# Patient Record
Sex: Female | Born: 1935 | Race: White | Hispanic: No | Marital: Married | Smoking: Never smoker
Health system: Southern US, Community
[De-identification: ages and names within clinical notes are randomized; demographics above are authoritative.]

## PROBLEM LIST (undated history)

## (undated) DIAGNOSIS — W19XXXA Unspecified fall, initial encounter: Secondary | ICD-10-CM

## (undated) DIAGNOSIS — R296 Repeated falls: Secondary | ICD-10-CM

## (undated) DIAGNOSIS — G309 Alzheimer's disease, unspecified: Principal | ICD-10-CM

## (undated) DIAGNOSIS — C801 Malignant (primary) neoplasm, unspecified: Secondary | ICD-10-CM

## (undated) DIAGNOSIS — E785 Hyperlipidemia, unspecified: Secondary | ICD-10-CM

## (undated) DIAGNOSIS — E039 Hypothyroidism, unspecified: Secondary | ICD-10-CM

## (undated) DIAGNOSIS — F329 Major depressive disorder, single episode, unspecified: Secondary | ICD-10-CM

## (undated) DIAGNOSIS — F0280 Dementia in other diseases classified elsewhere without behavioral disturbance: Secondary | ICD-10-CM

## (undated) DIAGNOSIS — I10 Essential (primary) hypertension: Secondary | ICD-10-CM

## (undated) DIAGNOSIS — Z9289 Personal history of other medical treatment: Secondary | ICD-10-CM

## (undated) DIAGNOSIS — F32A Depression, unspecified: Secondary | ICD-10-CM

## (undated) HISTORY — DX: Hyperlipidemia, unspecified: E78.5

## (undated) HISTORY — DX: Personal history of other medical treatment: Z92.89

## (undated) HISTORY — DX: Repeated falls: R29.6

## (undated) HISTORY — DX: Unspecified fall, initial encounter: W19.XXXA

## (undated) HISTORY — DX: Essential (primary) hypertension: I10

## (undated) HISTORY — DX: Hypothyroidism, unspecified: E03.9

## (undated) HISTORY — DX: Malignant (primary) neoplasm, unspecified: C80.1

## (undated) HISTORY — DX: Alzheimer's disease, unspecified: G30.9

## (undated) HISTORY — DX: Dementia in other diseases classified elsewhere, unspecified severity, without behavioral disturbance, psychotic disturbance, mood disturbance, and anxiety: F02.80

---

## 2003-12-03 ENCOUNTER — Ambulatory Visit (HOSPITAL_COMMUNITY): Admission: RE | Admit: 2003-12-03 | Discharge: 2003-12-03 | Payer: Self-pay | Admitting: *Deleted

## 2003-12-03 HISTORY — PX: CARDIAC CATHETERIZATION: SHX172

## 2012-11-23 ENCOUNTER — Encounter: Payer: Self-pay | Admitting: Cardiovascular Disease

## 2012-11-23 ENCOUNTER — Encounter: Payer: Self-pay | Admitting: *Deleted

## 2012-11-26 ENCOUNTER — Encounter: Payer: Self-pay | Admitting: Cardiovascular Disease

## 2012-11-26 ENCOUNTER — Ambulatory Visit (INDEPENDENT_AMBULATORY_CARE_PROVIDER_SITE_OTHER): Payer: Medicare Other | Admitting: Cardiovascular Disease

## 2012-11-26 VITALS — BP 110/80 | HR 63 | Ht 63.0 in | Wt 100.0 lb

## 2012-11-26 DIAGNOSIS — Z79899 Other long term (current) drug therapy: Secondary | ICD-10-CM

## 2012-11-26 DIAGNOSIS — I10 Essential (primary) hypertension: Secondary | ICD-10-CM

## 2012-11-26 DIAGNOSIS — E785 Hyperlipidemia, unspecified: Secondary | ICD-10-CM

## 2012-11-26 NOTE — Assessment & Plan Note (Signed)
On Zetia and statin therapy. We will recheck a fasting lipid and liver profile

## 2012-11-26 NOTE — Progress Notes (Signed)
11/26/2012 Janet Knight   1934-01-17  [id number]  Primary Physician Janet Rossetti, MD Primary Cardiologist: Janet Gess MD Janet Knight   HPI:  The patient is a 79 year old, thin and frail-appearing, married Caucasian female, mother of 2, grandmother to 2 grandchildren whose husband is also a patient of mine. I saw her a year ago. Her problems include treated hypertension, dyslipidemia and strong family history for heart disease with a father that had an MI at age 41 and brothers in their 13s and 85s. She has never smoked nor is she diabetic. She has never had a heart attack or stroke. She is otherwise asymptomatic. She was catheterized by Dr. Lenise Knight December 03, 2003, and was found to have normal coronary arteries and normal LV function. Her last stress test July 07, 2009, was nonischemic.since I saw her a year ago she denies chest pain or shortness of breath.    Current Outpatient Prescriptions  Medication Sig Dispense Refill  . amLODipine (NORVASC) 5 MG tablet Take 5 mg by mouth daily.      Marland Kitchen aspirin 81 MG tablet Take 81 mg by mouth daily.      . cholecalciferol (VITAMIN D) 400 UNITS TABS tablet Take 400 Units by mouth daily.      . Cyanocobalamin (VITAMIN B 12 PO) Take 1,000 Units by mouth daily.      Marland Kitchen donepezil (ARICEPT) 10 MG tablet Take 10 mg by mouth at bedtime as needed.      . ezetimibe (ZETIA) 10 MG tablet Take 10 mg by mouth daily.      . hydrochlorothiazide (HYDRODIURIL) 25 MG tablet Take 12.5 mg by mouth daily.      Marland Kitchen levothyroxine (SYNTHROID, LEVOTHROID) 50 MCG tablet Take 50 mcg by mouth daily before breakfast.      . metoprolol tartrate (LOPRESSOR) 25 MG tablet Take 25 mg by mouth 2 (two) times daily.      . potassium chloride SA (K-DUR,KLOR-CON) 20 MEQ tablet Take 20 mEq by mouth 2 (two) times daily.      . simvastatin (ZOCOR) 20 MG tablet Take 20 mg by mouth every evening.       No current facility-administered medications for this visit.     Allergies  Allergen Reactions  . Lipitor [Atorvastatin]     myalgias    History   Social History  . Marital Status: Married    Spouse Name: N/A    Number of Children: N/A  . Years of Education: N/A   Occupational History  . Not on file.   Social History Main Topics  . Smoking status: Never Smoker   . Smokeless tobacco: Not on file  . Alcohol Use: Not on file  . Drug Use: Not on file  . Sexual Activity: Not on file   Other Topics Concern  . Not on file   Social History Narrative  . No narrative on file     Review of Systems: General: negative for chills, fever, night sweats or weight changes.  Cardiovascular: negative for chest pain, dyspnea on exertion, edema, orthopnea, palpitations, paroxysmal nocturnal dyspnea or shortness of breath Dermatological: negative for rash Respiratory: negative for cough or wheezing Urologic: negative for hematuria Abdominal: negative for nausea, vomiting, diarrhea, bright red blood per rectum, melena, or hematemesis Neurologic: negative for visual changes, syncope, or dizziness All other systems reviewed and are otherwise negative except as noted above.    Blood pressure 110/80, pulse 63, height 5\' 3"  (1.6 m), weight 100 lb (45.36  kg).  General appearance: alert and no distress Neck: no adenopathy, no carotid bruit, no JVD, supple, symmetrical, trachea midline and thyroid not enlarged, symmetric, no tenderness/mass/nodules Lungs: clear to auscultation bilaterally Heart: regular rate and rhythm, S1, S2 normal, no murmur, click, rub or gallop Extremities: extremities normal, atraumatic, no cyanosis or edema  EKG normal sinus rhythm at 63 without ST or T wave changes  ASSESSMENT AND PLAN:   No problem-specific assessment & plan notes found for this encounter.      Janet Gess MD FACP,FACC,FAHA, [facility] 11/26/2012 10:51 AM

## 2012-11-26 NOTE — Assessment & Plan Note (Signed)
Well-controlled on current medications 

## 2012-11-26 NOTE — Patient Instructions (Addendum)
  We will see you back in follow up in 1 year with Dr Berry  Dr Berry has ordered blood work to be done fasting.    

## 2012-12-04 ENCOUNTER — Other Ambulatory Visit: Payer: Self-pay | Admitting: *Deleted

## 2012-12-04 MED ORDER — EZETIMIBE 10 MG PO TABS: 10.0000 mg | ORAL_TABLET | Freq: Every day | ORAL | Status: DC

## 2012-12-04 NOTE — Telephone Encounter (Signed)
Rx was sent to pharmacy electronically. 

## 2012-12-17 ENCOUNTER — Other Ambulatory Visit: Payer: Self-pay | Admitting: *Deleted

## 2012-12-17 MED ORDER — HYDROCHLOROTHIAZIDE 25 MG PO TABS: 12.5000 mg | ORAL_TABLET | Freq: Every day | ORAL | Status: DC

## 2012-12-18 ENCOUNTER — Telehealth: Payer: Self-pay | Admitting: Cardiovascular Disease

## 2012-12-18 NOTE — Telephone Encounter (Signed)
Pharmacy states that in April of this year---Janet Knight was taking HCTZ 25mg  1 daily---received new rx for HCTZ 25 mg yesterday for 1/2 tab daily.  Which is correct?

## 2012-12-19 MED ORDER — HYDROCHLOROTHIAZIDE 25 MG PO TABS: 12.5000 mg | ORAL_TABLET | Freq: Every day | ORAL | Status: DC

## 2012-12-19 NOTE — Telephone Encounter (Signed)
Call to pt and confirmed pt is taking 1/2 of 25mg  tab.    Returned call and Amy informed.  Refills changed to #5.

## 2013-12-06 ENCOUNTER — Other Ambulatory Visit: Payer: Self-pay | Admitting: Cardiovascular Disease

## 2013-12-06 NOTE — Telephone Encounter (Signed)
Rx refill sent to patient pharmacy   

## 2014-01-06 ENCOUNTER — Other Ambulatory Visit: Payer: Self-pay | Admitting: Cardiovascular Disease

## 2014-01-06 NOTE — Telephone Encounter (Signed)
Rx was sent to pharmacy electronically. 

## 2014-02-05 ENCOUNTER — Other Ambulatory Visit: Payer: Self-pay | Admitting: Cardiovascular Disease

## 2014-02-06 ENCOUNTER — Other Ambulatory Visit: Payer: Self-pay

## 2014-02-06 MED ORDER — HYDROCHLOROTHIAZIDE 12.5 MG PO CAPS: 12.5000 mg | ORAL_CAPSULE | Freq: Every day | ORAL | Status: DC

## 2014-02-06 MED ORDER — EZETIMIBE 10 MG PO TABS: 10.0000 mg | ORAL_TABLET | Freq: Every day | ORAL | Status: DC

## 2014-02-06 NOTE — Telephone Encounter (Signed)
Rx was sent to pharmacy electronically. 

## 2014-02-06 NOTE — Telephone Encounter (Signed)
Rx refill sent to patient pharmacy   

## 2014-02-24 ENCOUNTER — Other Ambulatory Visit: Payer: Self-pay | Admitting: Cardiovascular Disease

## 2014-03-07 ENCOUNTER — Telehealth: Payer: Self-pay | Admitting: Cardiovascular Disease

## 2014-03-11 ENCOUNTER — Telehealth: Payer: Self-pay | Admitting: *Deleted

## 2014-03-11 NOTE — Telephone Encounter (Signed)
LMTCB - PATIENT NEEDS APPOINTMENT

## 2014-03-12 MED ORDER — EZETIMIBE 10 MG PO TABS: 10.0000 mg | ORAL_TABLET | Freq: Every day | ORAL | Status: DC

## 2014-03-12 NOTE — Telephone Encounter (Signed)
Janet Knight called in stating that the pt needs a refill on their Zetia and HCTZ. Please call  Thanks

## 2014-03-12 NOTE — Telephone Encounter (Signed)
Rx refill sent to patient pharmacy  Patient has appointment on 05/06/13

## 2014-04-11 ENCOUNTER — Other Ambulatory Visit: Payer: Self-pay | Admitting: Cardiovascular Disease

## 2014-04-22 ENCOUNTER — Ambulatory Visit (INDEPENDENT_AMBULATORY_CARE_PROVIDER_SITE_OTHER): Payer: Medicare HMO | Admitting: Neurology

## 2014-04-22 ENCOUNTER — Encounter: Payer: Self-pay | Admitting: Neurology

## 2014-04-22 VITALS — BP 112/62 | HR 58 | Ht 62.0 in | Wt 106.4 lb

## 2014-04-22 DIAGNOSIS — G309 Alzheimer's disease, unspecified: Secondary | ICD-10-CM

## 2014-04-22 DIAGNOSIS — F0280 Dementia in other diseases classified elsewhere without behavioral disturbance: Secondary | ICD-10-CM

## 2014-04-22 MED ORDER — SERTRALINE HCL 50 MG PO TABS: 50.0000 mg | ORAL_TABLET | Freq: Every day | ORAL | Status: DC

## 2014-04-22 NOTE — Patient Instructions (Signed)
Alzheimer Disease Caregiver Guide Alzheimer disease is an illness that affects a person's brain. It causes a person to lose the ability to remember things and make good decisions. As the disease progresses, the person is unable to take care of himself or herself and needs more and more help to do simple tasks. Taking care of someone with Alzheimer disease can be very challenging and overwhelming.  MEMORY LOSS AND CONFUSION Memory loss and confusion is mild in the beginning stages of the disease. Both of these problems become more severe as the disease progresses. Eventually, the person will not recognize places or even close family members and friends.   Stay calm.  Respond with a short explanation. Long explanations can be overwhelming and confusing.  Avoid corrections that sound like scolding.  Try not to take it personally, even if the person forgets your name. BEHAVIOR CHANGES Behavior changes are part of the disease. The person may develop depression, anxiety, anger, hallucinations, or other behavior changes. These changes can come on suddenly and may be in response to pain, infection, changes in the environment (temperature, noise), overstimulation, or feeling lost or scared.   Try not to take behavior changes personally.  Remain calm and patient.  Do not argue or try to convince the person about a specific point. This will only make him or her more agitated.  Know that the behavior changes are part of the disease process and try to work through it. TIPS TO REDUCE FRUSTRATION  Schedule wisely by making appointments and doing daily tasks, like bathing and dressing, when the person is at his or her best.  Take your time. Simple tasks may take a lot longer, so be sure to allow for plenty of time.  Limit choices. Too many choices can be overwhelming and stressful for the person.  Involve the person in what you are doing.  Stick to a routine.  Avoid new or crowded situations, if  possible.  Use simple words, short sentences, and a calm voice. Only give one direction at a time.  Buy clothes and shoes that are easy to put on and take off.  Let people help if they offer. HOME SAFETY Keeping the home safe is very important to reduce the risk of falls and injuries.   Keep floors clear of clutter. Remove rugs, magazine racks, and floor lamps.  Keep hallways well lit.  Put a handrail and nonslip mat in the bathtub or shower.  Put childproof locks on cabinets with dangerous items, such as medicine, alcohol, guns, toxic cleaning items, sharp tools or utensils, matches, or lighters.  Place locks on doors where the person cannot easily see or reach them. This helps ensure that the person cannot wander out of the house and get lost.  Be prepared for emergencies. Keep a list of emergency phone numbers and addresses in a convenient area. PLANS FOR THE FUTURE  Do not put off talking about finances.  Talk about money management. People with Alzheimer disease have trouble managing their money as the disease gets worse.  Get help from professional advisors regarding financial and legal matters.  Do not put off talking about future care.  Choose a power of attorney. This is someone who can make decisions for the person with Alzheimer disease when he or she is no longer able to do so.  Talk about driving and when it is the right time to stop. The person's health care provider can help give advice on this matter.  Talk about   the person's living situation. If he or she lives alone, you need to make sure he or she is safe. Some people need extra help at home, and others need more care at a nursing home or care center. SUPPORT GROUPS Joining a support group can be very helpful for caregivers of people with Alzheimer disease. Some advantages to being part of a support group include:   Getting strategies to manage stress.  Sharing experiences with others.  Receiving  emotional comfort and support.  Learning new caregiving skills as the disease progresses.  Knowing what community resources are available and taking advantage of them. SEEK MEDICAL CARE IF:  The person has a fever.  The person has a sudden change in behavior that does not improve with calming strategies.  The person is unable to manage in his or her current living situation.  The person threatens you or anyone else, including himself or herself.  You are no longer able to care for the person. Document Released: 12/01/2003 Document Revised: 08/05/2013 Document Reviewed: 04/27/2011 ExitCare Patient Information 2015 [employer]. This information is not intended to replace advice given to you by your health care provider. Make sure you discuss any questions you have with your health care provider.  

## 2014-04-22 NOTE — Progress Notes (Signed)
Reason for visit: memory disturbance  Janet Knight is a 81 y.o. female  History of present illness:  Janet Knight is a 81 year old right-handed white female with a progressive memory disturbance. The patient has had some issues for least a year or more with memory. She has been placed on Aricept, and she is tolerating this fairly well. The family indicates that she sleeps a lot day and night, and the husband tries to keep her busy during the day. The patient has not driven a car since 7253, she does not do the finances. She does have problems repeating himself, and misplacing things about the house. She reports no problems with falls or significant issues with balance. She does have issues controlling the bowels and the bladder. She denies any numbness or weakness of extremities. She reports no headaches or focal numbness or weakness of the face, arms, or legs. The patient in the past was on Seroquel for some agitation issues and this seemed to work relatively well. More recently, she has been placed on low-dose Zoloft without significant improvement in agitation over the last 3 weeks. The patient is sent to this office for further evaluation. The agitation is becoming more of an issue over the last 3 months.  Past Medical History  Diagnosis Date  . Hypertension   . Dyslipidemia   . History of stress test 07/07/2009    was nonischemic  . Cancer     skin  . Hx of echocardiogram 11/2003    positive for anterior ischemia  . History of stress test 07/2009    normal myocardial perfusion study.  . Hypothyroid   . Alzheimer's disease 04/22/2014    Past Surgical History  Procedure Laterality Date  . Cardiac catheterization  12/03/2003    was found to have normal coronary arteries and normal LV funtion    Family History  Problem Relation Age of Onset  . Heart attack Father 36  . Heart attack Brother   . Heart disease Brother   . Heart failure Mother   . Cancer Sister     liver  .  Dementia Neg Hx     Social history:  reports that she has never smoked. She has never used smokeless tobacco. She reports that she does not drink alcohol or use illicit drugs.  Medications:  Current Outpatient Prescriptions on File Prior to Visit  Medication Sig Dispense Refill  . amLODipine (NORVASC) 5 MG tablet Take 5 mg by mouth daily.    Marland Kitchen aspirin 81 MG tablet Take 81 mg by mouth daily.    . cholecalciferol (VITAMIN D) 400 UNITS TABS tablet Take 400 Units by mouth daily.    . Cyanocobalamin (VITAMIN B 12 PO) Take 1,000 Units by mouth daily.    Marland Kitchen donepezil (ARICEPT) 10 MG tablet Take 10 mg by mouth at bedtime as needed.    . ezetimibe (ZETIA) 10 MG tablet Take 1 tablet (10 mg total) by mouth daily. NEED APPOINTMENT FOR FUTURE REFILLS. 30 tablet 1  . hydrochlorothiazide (MICROZIDE) 12.5 MG capsule TAKE 1 CAPSULE BY MOUTH DAILY. 30 capsule 0  . levothyroxine (SYNTHROID, LEVOTHROID) 50 MCG tablet Take 50 mcg by mouth daily before breakfast.    . metoprolol tartrate (LOPRESSOR) 25 MG tablet Take 25 mg by mouth 2 (two) times daily.    . potassium chloride SA (K-DUR,KLOR-CON) 20 MEQ tablet Take 20 mEq by mouth 2 (two) times daily.    . simvastatin (ZOCOR) 20 MG tablet Take 20 mg  by mouth every evening.     No current facility-administered medications on file prior to visit.      Allergies  Allergen Reactions  . Lipitor [Atorvastatin]     myalgias    ROS:  Out of a complete 14 system review of symptoms, the patient complains only of the following symptoms, and all other reviewed systems are negative.  Incontinence, diarrhea Memory loss, confusion Anxiety, too much sleep, disinterest in activities Sleepiness  Blood pressure 112/62, pulse 58, height 5\' 2"  (1.575 m), weight 106 lb 6.4 oz (48.263 kg).  Physical Exam  General: The patient is alert and cooperative at the time of the examination.  Eyes: Pupils are equal, round, and reactive to light. Discs are flat  bilaterally.  Neck: The neck is supple, no carotid bruits are noted.  Respiratory: The respiratory examination is clear.  Cardiovascular: The cardiovascular examination reveals a regular rate and rhythm, no obvious murmurs or rubs are noted.  Skin: Extremities are without significant edema.  Neurologic Exam  Mental status: The Mini-Mental Status Examination done today shows a total score 14/30.  Cranial nerves: Facial symmetry is present. There is good sensation of the face to pinprick and soft touch bilaterally. The strength of the facial muscles and the muscles to head turning and shoulder shrug are normal bilaterally. Speech is well enunciated, no aphasia or dysarthria is noted. Extraocular movements are full. Visual fields are full. The tongue is midline, and the patient has symmetric elevation of the soft palate. No obvious hearing deficits are noted.  Motor: The motor testing reveals 5 over 5 strength of all 4 extremities. Good symmetric motor tone is noted throughout.  Sensory: Sensory testing is intact to pinprick, soft touch, vibration sensation, and position sense on all 4 extremities. No evidence of extinction is noted.  Coordination: Cerebellar testing reveals good finger-nose-finger and heel-to-shin bilaterally. There is some apraxia with the use of the lower extremities, however.  Gait and station: Gait is normal. Tandem gait is normal. Romberg is negative. No drift is seen.  Reflexes: Deep tendon reflexes are symmetric and normal bilaterally. Toes are downgoing bilaterally.   Assessment/Plan:  1. Progressive memory disturbance, Alzheimer's disease  2. Behavioral disturbance, agitation  The patient will be increased on the Zoloft taking 50 mg daily. In the future, the addition of Namenda may be done. Seroquel can be used again if needed, the family believes that this did help her agitation. The patient will follow-up in 6 months.  Janet Alexanders MD 04/22/2014 7:11  PM  [facility] [address] [place], [place] [address]  Phone [phone] Fax [phone]

## 2014-05-06 ENCOUNTER — Encounter: Payer: Self-pay | Admitting: Cardiovascular Disease

## 2014-05-06 ENCOUNTER — Ambulatory Visit (INDEPENDENT_AMBULATORY_CARE_PROVIDER_SITE_OTHER): Payer: Medicare HMO | Admitting: Cardiovascular Disease

## 2014-05-06 VITALS — BP 98/60 | HR 54 | Ht 61.0 in | Wt 107.4 lb

## 2014-05-06 DIAGNOSIS — E785 Hyperlipidemia, unspecified: Secondary | ICD-10-CM

## 2014-05-06 DIAGNOSIS — I10 Essential (primary) hypertension: Secondary | ICD-10-CM | POA: Diagnosis not present

## 2014-05-06 NOTE — Assessment & Plan Note (Signed)
History of hypertension with blood pressure measured at 98/68. She is on amlodipine hydrochlorothiazide and metoprolol. Continue current meds at current dosing

## 2014-05-06 NOTE — Assessment & Plan Note (Signed)
History of hyperlipidemia on Zetia and simvastatin followed by her PCP

## 2014-05-06 NOTE — Progress Notes (Signed)
05/06/2014 Ricke Hey   07-07-1933  [id number]  Primary Physician Gilford Rile, MD Primary Cardiologist: Lorretta Harp MD Renae Gloss   HPI:  The patient is a 81 year old, thin and frail-appearing, married Caucasian female, mother of 2, grandmother to 2 grandchildren whose husband is also a patient of mine. I last saw her a year and a half ago. Her problems include treated hypertension, dyslipidemia and strong family history for heart disease with a father that had an MI at age 107 and brothers in their 71s and 63s. She has never smoked nor is she diabetic. She has never had a heart attack or stroke. She is otherwise asymptomatic. She was catheterized by Dr. Alla German December 03, 2003, and was found to have normal coronary arteries and normal LV function. Her last stress test July 07, 2009, was nonischemic.since I saw her a year ago she denies chest pain or shortness of breath.  Current Outpatient Prescriptions  Medication Sig Dispense Refill  . amLODipine (NORVASC) 5 MG tablet Take 5 mg by mouth daily.    Marland Kitchen aspirin 81 MG tablet Take 81 mg by mouth daily.    . cholecalciferol (VITAMIN D) 400 UNITS TABS tablet Take 400 Units by mouth daily.    . Cyanocobalamin (VITAMIN B 12 PO) Take 1,000 Units by mouth daily.    Marland Kitchen donepezil (ARICEPT) 10 MG tablet Take 10 mg by mouth at bedtime as needed.    . ezetimibe (ZETIA) 10 MG tablet Take 1 tablet (10 mg total) by mouth daily. NEED APPOINTMENT FOR FUTURE REFILLS. 30 tablet 1  . hydrochlorothiazide (MICROZIDE) 12.5 MG capsule TAKE 1 CAPSULE BY MOUTH DAILY. 30 capsule 0  . levothyroxine (SYNTHROID, LEVOTHROID) 50 MCG tablet Take 50 mcg by mouth daily before breakfast.    . metoprolol tartrate (LOPRESSOR) 25 MG tablet Take 25 mg by mouth 2 (two) times daily.    . potassium chloride SA (K-DUR,KLOR-CON) 20 MEQ tablet Take 20 mEq by mouth 2 (two) times daily.    . sertraline (ZOLOFT) 50 MG tablet Take 1 tablet (50 mg total) by mouth  daily. 30 tablet 1  . simvastatin (ZOCOR) 20 MG tablet Take 20 mg by mouth every evening.     No current facility-administered medications for this visit.    Allergies  Allergen Reactions  . Lipitor [Atorvastatin]     myalgias    History   Social History  . Marital Status: Married    Spouse Name: N/A    Number of Children: 2  . Years of Education: N/A   Occupational History  . Not on file.   Social History Main Topics  . Smoking status: Never Smoker   . Smokeless tobacco: Never Used  . Alcohol Use: No  . Drug Use: No  . Sexual Activity: Not on file   Other Topics Concern  . Not on file   Social History Narrative   Patient is right handed   Patient drinks 2 cups caffeine daily.     Review of Systems: General: negative for chills, fever, night sweats or weight changes.  Cardiovascular: negative for chest pain, dyspnea on exertion, edema, orthopnea, palpitations, paroxysmal nocturnal dyspnea or shortness of breath Dermatological: negative for rash Respiratory: negative for cough or wheezing Urologic: negative for hematuria Abdominal: negative for nausea, vomiting, diarrhea, bright red blood per rectum, melena, or hematemesis Neurologic: negative for visual changes, syncope, or dizziness All other systems reviewed and are otherwise negative except as noted above.  Blood pressure 98/60, pulse 54, height 5\' 1"  (1.549 m), weight 107 lb 6.4 oz (48.716 kg).  General appearance: alert and no distress Neck: no adenopathy, no carotid bruit, no JVD, supple, symmetrical, trachea midline and thyroid not enlarged, symmetric, no tenderness/mass/nodules Lungs: clear to auscultation bilaterally Heart: regular rate and rhythm, S1, S2 normal, no murmur, click, rub or gallop Extremities: extremities normal, atraumatic, no cyanosis or edema  EKG sinus bradycardia 54 without ST or T-wave changes. There was low voltage with left axis deviation and septal Q waves. I personally  reviewed this EKG  ASSESSMENT AND PLAN:   Hyperlipidemia History of hyperlipidemia on Zetia and simvastatin followed by her PCP   Essential hypertension History of hypertension with blood pressure measured at 98/68. She is on amlodipine hydrochlorothiazide and metoprolol. Continue current meds at current dosing       Lorretta Harp MD [facility], [facility] 05/06/2014 2:50 PM

## 2014-05-06 NOTE — Patient Instructions (Signed)
Your physician wants you to follow-up in: 1 Year. You will receive a reminder letter in the mail two months in advance. If you don't receive a letter, please call our office to schedule the follow-up appointment.  

## 2014-05-26 ENCOUNTER — Telehealth: Payer: Self-pay | Admitting: Adult Health

## 2014-05-26 NOTE — Telephone Encounter (Signed)
I called the patient's son. They're currently at the patient's primary care office. He states that they took the patient there because she's been crying all afternoon and agitated. He is unable to talk right now. He explained that he will try to call me back today. I explained that if I'm no longer in the office I will speak to them tomorrow at their appointment at 11:30 AM.

## 2014-05-26 NOTE — Telephone Encounter (Signed)
Patient's son Janet Knight is calling because his mother is having significant aggressive behavior issues and he doesn't know how to handle her.  He did schedule an appointment tomorrow 2/23 but needs advice today if possible. Please call.  Thank you!

## 2014-05-27 ENCOUNTER — Encounter: Payer: Self-pay | Admitting: Adult Health

## 2014-05-27 ENCOUNTER — Ambulatory Visit (INDEPENDENT_AMBULATORY_CARE_PROVIDER_SITE_OTHER): Payer: Medicare HMO | Admitting: Adult Health

## 2014-05-27 VITALS — BP 108/69 | HR 56 | Ht 64.0 in | Wt 105.0 lb

## 2014-05-27 DIAGNOSIS — F0391 Unspecified dementia with behavioral disturbance: Secondary | ICD-10-CM

## 2014-05-27 DIAGNOSIS — F03918 Unspecified dementia, unspecified severity, with other behavioral disturbance: Secondary | ICD-10-CM

## 2014-05-27 MED ORDER — MEMANTINE HCL 28 X 5 MG & 21 X 10 MG PO TABS: ORAL_TABLET | ORAL | Status: DC
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
## 2014-05-27 NOTE — Patient Instructions (Signed)
Continue CIPRO Start Namenda once CIPRO is finished. If namenda is tolerated- please call after titration pack is finished to start the maintenance dose.  If agitation persist we will consider starting Risperidone  Risperidone tablets What is this medicine? RISPERIDONE (ris PER i done) is an antipsychotic. It is used to treat schizophrenia, bipolar disorder, and some symptoms of autism. This medicine may be used for other purposes; ask your health care provider or pharmacist if you have questions. COMMON BRAND NAME(S): Risperdal What should I tell my health care provider before I take this medicine? They need to know if you have any of these conditions: -blood disorder or disease -dementia -diabetes or a family history of diabetes -difficulty swallowing -heart disease or previous heart attack -history of brain tumor or head injury -history of breast cancer -irregular heartbeat or low blood pressure -kidney or liver disease -Parkinson's disease -seizures (convulsions) -an unusual or allergic reaction to risperidone, other medicines, foods, dyes, or preservatives -pregnant or trying to get pregnant -breast-feeding How should I use this medicine? Take this medicine by mouth with a glass of water. Follow the directions on the prescription label. You can take it with or without food. If it upsets your stomach, take it with food. Take your medicine at regular intervals. Do not take it more often than directed. Do not stop taking except on your doctor's advice. Talk to your pediatrician regarding the use of this medicine in children. While this drug may be prescribed for children as young as 21 years of age for selected conditions, precautions do apply. Overdosage: If you think you have taken too much of this medicine contact a poison control center or emergency room at once. NOTE: This medicine is only for you. Do not share this medicine with others. What if I miss a dose? If you miss a dose,  take it as soon as you can. If it is almost time for your next dose, take only that dose. Do not take double or extra doses. What may interact with this medicine? Do not take this medicine with any of the following medications: -certain medicines for fungal infections like fluconazole, itraconazole, ketoconazole, posaconazole, voriconazole -cisapride -dofetilide -dronedarone -droperidol -pimozide -sparfloxacin -thioridazine -ziprasidone This medicine may also interact with the following medications: -arsenic trioxide -certain antibiotics like clarithromycin, gatifloxacin, levofloxacin, moxifloxacin, pentamidine, rifampin -certain medicines for blood pressure -certain medicines for cancer -certain medicines for irregular heart beat -certain medications for Parkinson's disease like levodopa -certain medicines for seizures like carbamazepine -certain medicines for sleep or sedation -narcotic medicines for pain -other medicines for mental anxiety, depression, or psychotic disturbances -other medicines that prolong the QT interval (cause an abnormal heart rhythm) -ritonavir This list may not describe all possible interactions. Give your health care provider a list of all the medicines, herbs, non-prescription drugs, or dietary supplements you use. Also tell them if you smoke, drink alcohol, or use illegal drugs. Some items may interact with your medicine. What should I watch for while using this medicine? Visit your doctor or health care professional for regular checks on your progress. It may be several weeks before you see the full effects. Do not suddenly stop taking this medicine. You may need to gradually reduce the dose. Only stop taking this medicine on the advice of your doctor or health care professional. Dennis Bast may get dizzy or drowsy. Do not drive, use machinery, or do anything that needs mental alertness until you know how this medicine affects you. Do not stand or sit  up quickly,  especially if you are an older patient. This reduces the risk of dizzy or fainting spells. Alcohol can increase dizziness and drowsiness. Avoid alcoholic drinks. You can get a hangover effect the morning after a bedtime dose. Do not treat yourself for colds, diarrhea or allergies. Ask your doctor or health care professional for advice, some nonprescription medicines may increase possible side effects. This medicine can reduce the response of your body to heat or cold. Dress warm in cold weather and stay hydrated in hot weather. If possible, avoid extreme temperatures like saunas, hot tubs, very hot or cold showers, or activities that can cause dehydration such as vigorous exercise. What side effects may I notice from receiving this medicine? Side effects that you should report to your doctor or health care professional as soon as possible: -aching muscles and joints -confusion -fainting spells -fast or irregular heartbeat -fever or chills, sore throat -increased hunger or thirst -increased urination -loss of balance, difficulty walking or falls -stiffness, spasms, trembling -uncontrollable head, mouth, neck, arm, or leg movements -unusually weak or tired Side effects that usually do not require medical attention (report to your doctor or health care professional if they continue or are bothersome): -constipation -decreased sexual ability -difficulty sleeping -drowsiness or dizziness -increase or decrease in saliva -nausea, vomiting -weight gain This list may not describe all possible side effects. Call your doctor for medical advice about side effects. You may report side effects to FDA at 1-800-FDA-1088. Where should I keep my medicine? Keep out of the reach of children. Store at room temperature between 15 and 25 degrees C (59 and 77 degrees F). Protect from light. Throw away any unused medicine after the expiration date. NOTE: This sheet is a summary. It may not cover all possible  information. If you have questions about this medicine, talk to your doctor, pharmacist, or health care provider.  2015, Elsevier/Gold Standard. (2012-10-15 14:22:54)  Memantine Tablets What is this medicine? MEMANTINE (MEM an teen) is used to treat dementia caused by Alzheimer's disease. This medicine may be used for other purposes; ask your health care provider or pharmacist if you have questions. COMMON BRAND NAME(S): Namenda What should I tell my health care provider before I take this medicine? They need to know if you have any of these conditions: -difficulty passing urine -kidney disease -liver disease -seizures -an unusual or allergic reaction to memantine, other medicines, foods, dyes, or preservatives -pregnant or trying to get pregnant -breast-feeding How should I use this medicine? Take this medicine by mouth with a glass of water. Follow the directions on the prescription label. You may take this medicine with or without food. Take your doses at regular intervals. Do not take your medicine more often than directed. Continue to take your medicine even if you feel better. Do not stop taking except on the advice of your doctor or health care professional. Talk to your pediatrician regarding the use of this medicine in children. Special care may be needed. Overdosage: If you think you have taken too much of this medicine contact a poison control center or emergency room at once. NOTE: This medicine is only for you. Do not share this medicine with others. What if I miss a dose? If you miss a dose, take it as soon as you can. If it is almost time for your next dose, take only that dose. Do not take double or extra doses. If you do not take your medicine for several days, contact your health  care provider. Your dose may need to be changed. What may interact with this  medicine? -acetazolamide -amantadine -cimetidine -dextromethorphan -dofetilide -hydrochlorothiazide -ketamine -metformin -methazolamide -quinidine -ranitidine -sodium bicarbonate -triamterene This list may not describe all possible interactions. Give your health care provider a list of all the medicines, herbs, non-prescription drugs, or dietary supplements you use. Also tell them if you smoke, drink alcohol, or use illegal drugs. Some items may interact with your medicine. What should I watch for while using this medicine? Visit your doctor or health care professional for regular checks on your progress. Check with your doctor or health care professional if there is no improvement in your symptoms or if they get worse. You may get drowsy or dizzy. Do not drive, use machinery, or do anything that needs mental alertness until you know how this drug affects you. Do not stand or sit up quickly, especially if you are an older patient. This reduces the risk of dizzy or fainting spells. Alcohol can make you more drowsy and dizzy. Avoid alcoholic drinks. What side effects may I notice from receiving this medicine? Side effects that you should report to your doctor or health care professional as soon as possible: -allergic reactions like skin rash, itching or hives, swelling of the face, lips, or tongue -agitation or a feeling of restlessness -depressed mood -dizziness -hallucinations -redness, blistering, peeling or loosening of the skin, including inside the mouth -seizures -vomiting Side effects that usually do not require medical attention (report to your doctor or health care professional if they continue or are bothersome): -constipation -diarrhea -headache -nausea -trouble sleeping This list may not describe all possible side effects. Call your doctor for medical advice about side effects. You may report side effects to FDA at 1-800-FDA-1088. Where should I keep my medicine? Keep  out of the reach of children. Store at room temperature between 15 degrees and 30 degrees C (59 degrees and 86 degrees F). Throw away any unused medicine after the expiration date. NOTE: This sheet is a summary. It may not cover all possible information. If you have questions about this medicine, talk to your doctor, pharmacist, or health care provider.  2015, Elsevier/Gold Standard. (2013-01-07 14:10:42)

## 2014-05-27 NOTE — Progress Notes (Signed)
I have read the note, and I agree with the clinical assessment and plan.  Marcelo Ickes KEITH   

## 2014-05-28 ENCOUNTER — Telehealth: Payer: Self-pay | Admitting: Adult Health
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
## 2014-05-29 ENCOUNTER — Telehealth: Payer: Self-pay | Admitting: *Deleted
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
## 2014-05-29 NOTE — Telephone Encounter (Signed)
Patient son calling wanting to give an update on the patient since calling 911 yesterday. The patient went to [facility] yesterday but was discharged. Please call

## 2014-05-30 ENCOUNTER — Telehealth: Payer: Self-pay | Admitting: Neurology

## 2014-05-30 MED ORDER — RISPERIDONE 1 MG PO TABS: 1.0000 mg | ORAL_TABLET | Freq: Every day | ORAL | Status: DC
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
## 2014-05-30 NOTE — Telephone Encounter (Signed)
Dear Janet Knight it seems Mrs. Janet Knight and Janet Knight's family is truly struggling with agitation and almost psychotic behaviors at home she was seen at the local ER and even admitted to [facility] over the last 2 nights with a UTI which is likely to be blamed for her behavioral decompensation. Her husband Janet Knight is at the end of this strength.  The hospital prescribed 100 mg Seroquel for this rather petite lady and I learned that Janet Knight had offered to call in risperidol, . I called the Risperdal in at 1 mg up to twice a day - I would rather be reluctant to take such a high-dose of Seroquel for this patient.  I also asked the family to family to look into [place]- psychiatry, as a form of getting medication adjusted and indirectly as a respite care for Janet Knight.  Since she has recurrent UTIs and is not hydrating very well I advised the son to by some cranberry dried extract or cranberry dried pills she will not be drinking that heart juices he explained that he is optimistic that he could get her to swallow a pill form of concentrated cranberry.  Janet Knight

## 2014-05-30 NOTE — Telephone Encounter (Signed)
I called the patient's son. I left a voicemail. He can call our office at his convenience.

## 2014-05-30 NOTE — Telephone Encounter (Signed)
Patients family stated they did not receive a call back, patient is very agitated, 911 called twice, Megan called resperidol.
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
## 2014-06-03 ENCOUNTER — Telehealth: Payer: Self-pay | Admitting: *Deleted

## 2014-06-03 MED ORDER — MEMANTINE HCL 28 X 5 MG & 21 X 10 MG PO TABS: ORAL_TABLET | ORAL | Status: DC

## 2014-06-03 MED ORDER — RISPERIDONE 1 MG PO TABS: 1.0000 mg | ORAL_TABLET | Freq: Every day | ORAL | Status: DC

## 2014-06-03 NOTE — Telephone Encounter (Signed)
RX faxed [phone].

## 2014-06-03 NOTE — Telephone Encounter (Signed)
Prescriptions printed. Hinton Dyer can you fax these medications. Thanks.

## 2014-06-03 NOTE — Telephone Encounter (Signed)
Order for Namenda and Risperdal needs to be faxed to [facility]. The patient will be there for 30 days. Please call with any questions [phone]

## 2014-06-12 ENCOUNTER — Other Ambulatory Visit: Payer: Self-pay | Admitting: Cardiovascular Disease

## 2014-06-12 ENCOUNTER — Other Ambulatory Visit: Payer: Self-pay | Admitting: Neurology

## 2014-06-13 NOTE — Telephone Encounter (Signed)
Rx has been sent to the pharmacy electronically. ° °

## 2014-06-19 ENCOUNTER — Encounter: Payer: Self-pay | Admitting: Neurology

## 2014-06-19 ENCOUNTER — Telehealth: Payer: Self-pay | Admitting: Neurology

## 2014-06-19 NOTE — Telephone Encounter (Signed)
Patient's son Cecilie Lowers is calling to get a letter stating that patient is competent to sign legal documents. Please call patient's son to discuss. Thank you.

## 2014-06-19 NOTE — Telephone Encounter (Signed)
I called the son. I will dictate a letter indicating that the patient is competent to sign basic legal documents. The patient is in a mild range of dementia, she has had significant improvement in her mental status following the urinary tract infection.

## 2014-07-08 ENCOUNTER — Telehealth: Payer: Self-pay | Admitting: Neurology

## 2014-07-08 NOTE — Telephone Encounter (Signed)
Patient's son Janet Knight, stated patient has been on Rx memantine (NAMENDA TITRATION PAK) tablet pack for at least a month. For the last 2.5 weeks, patient sits with eyes close and have to be reminded to eat.  Her mobility while walking is compeletly off.  Went to hospital for BW.  Questioning what to do?  Please call and advise.

## 2014-07-08 NOTE — Telephone Encounter (Signed)
I called the son. The patient has had increased drowsiness, sleepiness over the last several days. She has gone to the hospital, blood work as been done, no obvious source of her somnolence has been identified.  The patient has recently gotten up to the full dose of Namenda, and Namenda can cause somnolence, we will discontinue the drug, the patient does not improve, she may require a revisit through this office.  I will fax orders to discontinue the Namenda to [phone], attention Greg.

## 2014-08-14 ENCOUNTER — Telehealth: Payer: Self-pay | Admitting: Neurology

## 2014-08-14 NOTE — Telephone Encounter (Signed)
Janet Knight, pt's on son called wanting to speak with Dr. Jannifer Franklin regarding pt's script for memantine [facility] TITRATION PAK) tablet pack. He states that patient condition is slowly getting worse and would Like to know what his suggestion would be on the use for [facility]. Please call and advise. Janet Knight can be reached @ [phone]

## 2014-08-14 NOTE — Telephone Encounter (Signed)
I called the son. The patient has had a transition over the last couple months, increased weakness, difficulty walking. The patient has improved with the drowsiness off of Namenda, but she still has not gotten back to her baseline. She is not having agitation. The patient is on Risperdal, I wonder if she is developing parkinsonian features related to this medication. The other possibility is that she is getting severe depression, with withdrawal. The patient will be seen in office tomorrow by Cecille Rubin. I may be able to see the patient briefly tomorrow while she is here.

## 2014-08-15 ENCOUNTER — Encounter: Payer: Self-pay | Admitting: Nurse Practitioner

## 2014-08-15 ENCOUNTER — Ambulatory Visit (INDEPENDENT_AMBULATORY_CARE_PROVIDER_SITE_OTHER): Payer: Medicare HMO | Admitting: Nurse Practitioner

## 2014-08-15 VITALS — BP 130/74 | HR 67 | Ht 64.0 in | Wt 105.6 lb

## 2014-08-15 DIAGNOSIS — G309 Alzheimer's disease, unspecified: Secondary | ICD-10-CM | POA: Diagnosis not present

## 2014-08-15 DIAGNOSIS — Z5181 Encounter for therapeutic drug level monitoring: Secondary | ICD-10-CM

## 2014-08-15 DIAGNOSIS — G2119 Other drug induced secondary parkinsonism: Secondary | ICD-10-CM

## 2014-08-15 DIAGNOSIS — F0280 Dementia in other diseases classified elsewhere without behavioral disturbance: Secondary | ICD-10-CM

## 2014-08-15 NOTE — Patient Instructions (Signed)
Stop Risperdal Check MRI of the brain with and without contrast  thyroid B-12,  copper today Follow-up in 3 months
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
## 2014-08-15 NOTE — Progress Notes (Signed)
I have read the note, and I agree with the clinical assessment and plan.  WILLIS,CHARLES KEITH   

## 2014-08-16 LAB — BASIC METABOLIC PANEL
BUN/Creatinine Ratio: 19 (ref 11–26)
BUN: 17 mg/dL (ref 8–27)
CO2: 24 mmol/L (ref 18–29)
CREATININE: 0.91 mg/dL (ref 0.57–1.00)
Calcium: 9.4 mg/dL (ref 8.7–10.3)
Chloride: 98 mmol/L (ref 97–108)
GFR calc Af Amer: 70 mL/min/{1.73_m2} (ref >59)
GFR calc non Af Amer: 61 mL/min/{1.73_m2} (ref >59)
Glucose: 84 mg/dL (ref 65–99)
POTASSIUM: 4.5 mmol/L (ref 3.5–5.2)
SODIUM: 139 mmol/L (ref 134–144)

## 2014-08-16 LAB — VITAMIN B12: VITAMIN B 12: 989 pg/mL — AB (ref 211–946)

## 2014-08-16 LAB — CERULOPLASMIN: CERULOPLASMIN: 27.9 mg/dL (ref 19.0–39.0)

## 2014-08-16 LAB — TSH: TSH: 6.97 u[IU]/mL — ABNORMAL HIGH (ref 0.450–4.500)

## 2014-08-18 ENCOUNTER — Telehealth: Payer: Self-pay | Admitting: *Deleted

## 2014-08-18 NOTE — Telephone Encounter (Signed)
-----   Message from Dennie Bible, NP sent at 08/18/2014  9:33 AM EDT ----- Please let nsg home know TSH is high. PCP can manage. Please let son know as well Send copy

## 2014-08-18 NOTE — Telephone Encounter (Signed)
LMVM for pt or son to call back re: lab results.

## 2014-08-20 NOTE — Progress Notes (Signed)
Quick Note:  I called and spoke to husband and gave him the results of the lab tests. (normal except for elevated TSH). She resides at [facility]. [phone]. Will fax lab results to them. He will also mention to them as well. Fax # [phone] ______

## 2014-08-20 NOTE — Telephone Encounter (Signed)
I called and spoke ot husband, then son, Cecilie Lowers and let him know about the lab results.  Elevated TSH.  She is taking synthroid 51mcg daily now.  I spoke to Cassie Freer and then Seth Bake who is in the nursing office.  They received copy of lab results.   Dr. Marjorie Smolder to be by tomorrow and see pts.  He will address this then.  Also son, wanted me to check about them receiving hard copy to stop Risperdal.  Seth Bake stated that they have, and was stopped  08-15-14.

## 2014-09-02 ENCOUNTER — Ambulatory Visit
Admission: RE | Admit: 2014-09-02 | Discharge: 2014-09-02 | Disposition: A | Discharge status: Home / Self Care | Payer: Commercial Managed Care - HMO | Source: Ambulatory Visit | Attending: Nurse Practitioner | Admitting: Nurse Practitioner

## 2014-09-02 ENCOUNTER — Telehealth: Payer: Self-pay | Admitting: Nurse Practitioner

## 2014-09-02 DIAGNOSIS — F0280 Dementia in other diseases classified elsewhere without behavioral disturbance: Secondary | ICD-10-CM

## 2014-09-02 DIAGNOSIS — G309 Alzheimer's disease, unspecified: Principal | ICD-10-CM

## 2014-09-02 NOTE — Telephone Encounter (Signed)
Patient's son Che Below @[phone] is calling in regard to the MRI results and the need for a medication to accomplish this.   Also he would like to speak with Dr. Jannifer Franklin about the her change in being aggitaterdas well as her elevated thyroid. He would also like to speak with Dr Jannifer Franklin  about changes since she is not taking risperaeal. Please call. Thanks!

## 2014-09-02 NOTE — Telephone Encounter (Signed)
I called Cecilie Lowers. I explained that we normally give xanax for MRIs and I would make sure it was okay with Dr. Jannifer Franklin for the patient to have it. He verbalized understanding. He stated that he cannot tell a difference in his mother's ability to walk/move since taking her off the Risperdal. He stated that today he noticed her get "snappy" with a nurse and he is worried she may become agitated now that she is off the Risperdal. He feels like maybe his mother is mimicking the other residents at her nursing facility. He does not think all of these symptoms are related to the disease process alone. He stated the nursing facility checked her thyroid levels again and they were still high. He wondered what should be done about this. He would like to talk to Dr. Jannifer Franklin.

## 2014-09-02 NOTE — Telephone Encounter (Signed)
LMVM for Janet Knight, son of pt to return call.  (normally use xanax for MRI).  Also wanted to know about TSH results when MD seen at her facility.

## 2014-09-02 NOTE — Telephone Encounter (Signed)
I called the son. The patient may not have any benefit with walking off of the Risperdal for 6-8 weeks following cessation of the drug. It may be too early to see any difference. The patient is agitated, other medications may need to be used for agitation, potentially could use Depakote. Okay to give alprazolam prior to the MRI study.

## 2014-09-02 NOTE — Telephone Encounter (Signed)
Patient returned call and requested to speak to [facility]. Please call and advise.

## 2014-09-02 NOTE — Telephone Encounter (Signed)
I called and Cecilie Lowers had spoken to [place], Therapist, sports.  I relayed she sent message to Dr. Jannifer Franklin.  [phone] Craig's #.

## 2014-09-02 NOTE — Telephone Encounter (Signed)
Remo Lipps with [facility] imaging called and stated that they could not preform the MRI on the patient because she would not stop moving. She stated that the patient may need medication to complete the MRI and requested to speak with someone regarding this. Please call and advise.

## 2014-09-03 NOTE — Telephone Encounter (Signed)
I called Cecilie Lowers to let him know Xanax is ready at the front desk for him to pick up for his mother's MRI scan. I explained that he would need a photo id. I also explained to him how to take the Xanax and that all of the information is written out on the package.

## 2014-10-09 ENCOUNTER — Ambulatory Visit: Payer: Medicare HMO | Admitting: Nurse Practitioner

## 2014-10-09 ENCOUNTER — Telehealth: Payer: Self-pay | Admitting: *Deleted

## 2014-10-09 ENCOUNTER — Ambulatory Visit: Payer: Medicare HMO | Admitting: Adult Health

## 2014-10-09 NOTE — Telephone Encounter (Signed)
Please note that f/u appt was to be with Dr. Jannifer Franklin

## 2014-10-09 NOTE — Telephone Encounter (Signed)
I called pts son, Cecilie Lowers.   He would like pt seen for behavioral changes (did have labs here but attempted MRI x one back on 09-02-14 (could not stay still).  Did get xanax but has not been rescheduled since due to son's schedule (out of town a lot).  He would accompany her.  Has noted lashing out (sundown), repetitive words, wandering, behavioral changes.  Would forward to CM/NP.  Pt needs to have MRI prior to being seen.  DanaC spoke to pts son and facility.  Will get MRI first and then see afterwards.

## 2014-10-09 NOTE — Telephone Encounter (Addendum)
Patient has been scheduled for her MRI  7/12/16and patient will need to follow up  with Dr. Jannifer Franklin . Patient son Cecilie Lowers will need to come to apt with his mother because Dad forget's some things I stated to Cecilie Lowers I will call him back with a follow up with Dr. Jannifer Franklin. Called Craig Back patient is scheduled with Dr. Jannifer Franklin. For Follow up June 27th with Dr. Jannifer Franklin MRI June 12 th .   Charisse Klinefelter  Will you call Cecilie Lowers patient's son he is on [place] at [phone] to give him instructions on Xanax ok to leave detailed message.

## 2014-10-09 NOTE — Telephone Encounter (Signed)
Called and spoke to Patient's son. And relayed patient really needed to have her MRI done be fore she came back for follow . Patient's son was understanding . Son Cecilie Lowers relayed that he will be in town next  Week and will get MRI rescheduled. Offered to [place] to call and reschedule MRI. Cecilie Lowers stated he will take care of rescheduling MRI. Cecilie Lowers was ok with this process and he will speak to his father as well.

## 2014-10-09 NOTE — Telephone Encounter (Signed)
I called Cecilie Lowers and left a voicemail. I explained that the instructions on the packet state to take 1-2 tablets 30 minutes prior to MRI and right before entering the scanner, the 3rd tablet may be taken, if needed. I also advised that the patient is not to drive after taking the Xanax. I asked Cecilie Lowers to please call me back if he had any specific questions I did not answer.

## 2014-10-14 ENCOUNTER — Ambulatory Visit
Admission: RE | Admit: 2014-10-14 | Discharge: 2014-10-14 | Disposition: A | Discharge status: Home / Self Care | Payer: Commercial Managed Care - HMO | Source: Ambulatory Visit | Attending: Nurse Practitioner | Admitting: Nurse Practitioner

## 2014-10-14 MED ORDER — GADOBENATE DIMEGLUMINE 529 MG/ML IV SOLN
10.0000 mL | Freq: Once | INTRAVENOUS | Status: AC | PRN
  Administered 2014-10-14: 10 mL via INTRAVENOUS

## 2014-10-20 ENCOUNTER — Telehealth: Payer: Self-pay | Admitting: Neurology

## 2014-10-20 NOTE — Telephone Encounter (Signed)
I called the son. The patient has had some worsening of agitation off of the Risperdal, no significant change in the ability to ambulate. The patient has been placed on Ativan on a scheduled basis, twice daily over the last couple weeks with some worsening of agitation. Depakote has been added recently, again the patient has worsened on this medication. I will try to call the extended care facility at [phone] and have them stop the Depakote. The patient will be seen through this office on 10/29/2014. We will need to go with the medications at that point, we may get the patient back on an anti-psychotic medication such as Abilify or Zyprexa. The Zoloft could also be increased.   I called the nurse. The patient is to come off of the Depakote. She is on alprazolam at this time, taken off of the Ativan taking 0.25 mg as needed. I will see her in the office later this month.
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
## 2014-10-20 NOTE — Telephone Encounter (Signed)
Patient's son Janet Knight is calling and would like to spealk with Dr. Jannifer Franklin about some things going on with his mother. He states the facility has put her on depokate and he has noticed an extreme change with his mother's behavior.  He is going out of town and would appreciate a call by 2:00.  If not possible he will be landing at destination by 5:00 and can be reached.

## 2014-10-21 ENCOUNTER — Ambulatory Visit: Payer: Commercial Managed Care - HMO | Admitting: Adult Health

## 2014-10-29 ENCOUNTER — Ambulatory Visit (INDEPENDENT_AMBULATORY_CARE_PROVIDER_SITE_OTHER): Payer: Medicare HMO | Admitting: Neurology

## 2014-10-29 ENCOUNTER — Encounter: Payer: Self-pay | Admitting: Neurology

## 2014-10-29 VITALS — BP 106/53 | HR 57 | Ht 62.0 in | Wt 102.6 lb

## 2014-10-29 DIAGNOSIS — F0280 Dementia in other diseases classified elsewhere without behavioral disturbance: Secondary | ICD-10-CM

## 2014-10-29 DIAGNOSIS — G309 Alzheimer's disease, unspecified: Secondary | ICD-10-CM

## 2014-10-29 NOTE — Progress Notes (Signed)
Reason for visit: Alzheimer's disease  Janet Knight is an 81 y.o. female  History of present illness:  Janet Knight is a 81 year old right-handed white female with a progressive dementing illness consistent with Alzheimer's disease. The patient had been on Risperdal, she developed a gait disorder that may have been related to secondary parkinsonism. The patient was taken off of this medication, but no clear improvement of the walking was seen. The patient has had a fall within the last several weeks. She did not sustain significant injury. Off of the Risperdal, her agitation has gotten much worse. Use of lorazepam and Depakote were not effective. The patient is on low-dose alprazolam taking 0.25 mg twice daily if needed, this has no effect on her. The patient is brought in today for further evaluation.  Past Medical History  Diagnosis Date  . Hypertension   . Dyslipidemia   . History of stress test 07/07/2009    was nonischemic  . Cancer     skin  . Hx of echocardiogram 11/2003    positive for anterior ischemia  . History of stress test 07/2009    normal myocardial perfusion study.  . Hypothyroid   . Alzheimer's disease 04/22/2014  . Falls     Past Surgical History  Procedure Laterality Date  . Cardiac catheterization  12/03/2003    was found to have normal coronary arteries and normal LV funtion    Family History  Problem Relation Age of Onset  . Heart attack Father 29  . Heart attack Brother   . Heart disease Brother   . Heart failure Mother   . Cancer Sister     liver  . Dementia Neg Hx     Social history:  reports that she has never smoked. She has never used smokeless tobacco. She reports that she does not drink alcohol or use illicit drugs.    Allergies  Allergen Reactions  . Lipitor [Atorvastatin]     myalgias    Medications:  Prior to Admission medications   Medication Sig Start Date End Date Taking? Authorizing Provider  acetaminophen (TYLENOL)  500 MG tablet Take 1,000 mg by mouth every 6 (six) hours as needed.   Yes Historical Provider, MD  ALPRAZolam Duanne Moron) 0.5 MG tablet Take 0.5 mg by mouth 2 (two) times daily as needed for anxiety.   Yes Historical Provider, MD  alum & mag hydroxide-simeth (MAALOX/MYLANTA) 200-200-20 MG/5ML suspension Take 30 mLs by mouth every 6 (six) hours as needed for indigestion or heartburn.   Yes Historical Provider, MD  amLODipine (NORVASC) 5 MG tablet Take 5 mg by mouth daily.   Yes Historical Provider, MD  aspirin 325 MG tablet Take 325 mg by mouth daily.   Yes Historical Provider, MD  aspirin 81 MG tablet Take 81 mg by mouth daily.   Yes Historical Provider, MD  calcium citrate-vitamin D (CITRACAL+D) 315-200 MG-UNIT per tablet Take 1 tablet by mouth 2 (two) times daily.   Yes Historical Provider, MD  cholecalciferol (VITAMIN D) 1000 UNITS tablet Take 2,000 Units by mouth daily.   Yes Historical Provider, MD  Cyanocobalamin (VITAMIN B 12 PO) Take 1,000 Units by mouth daily.   Yes Historical Provider, MD  donepezil (ARICEPT) 10 MG tablet Take 10 mg by mouth at bedtime as needed.   Yes Historical Provider, MD  ezetimibe (ZETIA) 10 MG tablet Take 1 tablet (10 mg total) by mouth daily. NEED APPOINTMENT FOR FUTURE REFILLS. 03/12/14  Yes Lorretta Harp, MD  guaiFENesin (ROBITUSSIN) 100 MG/5ML liquid Take 200 mg by mouth 3 (three) times daily as needed for cough.   Yes Historical Provider, MD  hydrochlorothiazide (MICROZIDE) 12.5 MG capsule TAKE 1 CAPSULE BY MOUTH DAILY. 06/13/14  Yes Lorretta Harp, MD  levothyroxine (SYNTHROID, LEVOTHROID) 50 MCG tablet Take 50 mcg by mouth daily before breakfast.   Yes Historical Provider, MD  loperamide (IMODIUM) 2 MG capsule Take 2 mg by mouth as needed for diarrhea or loose stools.   Yes Historical Provider, MD  metoprolol tartrate (LOPRESSOR) 25 MG tablet Take 25 mg by mouth 2 (two) times daily.   Yes Historical Provider, MD  Multiple Vitamins-Minerals (MULTIVITAMIN WITH  IRON-MINERALS) liquid Take by mouth daily.   Yes Historical Provider, MD  OLANZapine (ZYPREXA) 2.5 MG tablet Take 2.5 mg by mouth at bedtime.   Yes Historical Provider, MD  potassium chloride SA (K-DUR,KLOR-CON) 20 MEQ tablet Take 20 mEq by mouth 2 (two) times daily.   Yes Historical Provider, MD  sertraline (ZOLOFT) 50 MG tablet TAKE 1 TABLET BY MOUTH DAILY. 06/12/14  Yes Kathrynn Ducking, MD  simvastatin (ZOCOR) 20 MG tablet Take 20 mg by mouth every evening.   Yes Historical Provider, MD    ROS:  Out of a complete 14 system review of symptoms, the patient complains only of the following symptoms, and all other reviewed systems are negative.  Fatigue Shortness of breath, chest tightness Swollen abdomen, incontinence of bowels Wandering Walking difficulty Memory loss, dizziness, headache, weakness Agitation, behavior problem, confusion, nervousness  Blood pressure 106/53, pulse 57, height 5\' 2"  (1.575 m), weight 102 lb 9.6 oz (46.539 kg).  Physical Exam  General: The patient is alert and cooperative at the time of the examination.  Skin: No significant peripheral edema is noted.   Neurologic Exam  Mental status: The patient is alert and oriented x 1 at the time of the examination (not oriented to place or date). The Mini-Mental status examination done today shows a total score of 10/30. The patient is not able to name any animals in 30 seconds.   Cranial nerves: Facial symmetry is present. Speech is normal, no aphasia or dysarthria is noted. Extraocular movements are full. Visual fields are full to threat.  Motor: The patient has good strength in all 4 extremities.  Sensory examination: Soft touch sensation is difficult to assess.  Coordination: The patient has good finger-nose-finger bilaterally, the patient refuses to do heel-to-shin.  Gait and station: The patient is able to walk independently, she has good stability, good turns.  Reflexes: Deep tendon reflexes are  symmetric.   Assessment/Plan:  1. Alzheimer's disease  2. Mild gait disorder  3. Agitation  The patient has had worsening behavior off of Risperdal. We will switch to Zyprexa which may have fewer effects creating parkinsonism. The patient will be increased on the alprazolam taking 0.5 mg twice daily if needed. She will follow-up in 3 or 4 months. The family will contact me if further issues are noted.  Jill Alexanders MD 10/30/2014 7:45 AM  [facility] [address] [place], [place] [address]  Phone [phone] Fax [phone]

## 2014-10-29 NOTE — Patient Instructions (Addendum)
We will increase the alprazolam to 0.5 mg twice daily if needed. We will add Zyprexa 2.5 mg tablets to be taken daily. Please contact our office if the desired effects are not seen. Otherwise, will follow-up in about 3 months.   Alzheimer Disease Alzheimer disease is a mental disorder. It causes memory loss and loss of other mental functions, such as learning, thinking, problem solving, communicating, and completing tasks. The mental losses interfere with the ability to perform daily activities at work, at home, or in social situations. Alzheimer disease usually starts in a person's late 32s or early 15s but can start earlier in life (familial form). The mental changes caused by this disease are permanent and worsen over time. As the illness progresses, the ability to do even the simplest things is lost. Survival with Alzheimer disease ranges from several years to as long as 20 years. CAUSES Alzheimer disease is caused by abnormally high levels of a protein (beta-amyloid) in the brain. This protein forms very small deposits within and around the brain's nerve cells. These deposits prevent the nerve cells from working properly. Experts are not certain what causes the beta-amyloid deposits in this disease. RISK FACTORS The following major risk factors have been identified:  Increasing age.  Certain genetic variations, such as Down syndrome (trisomy 21). SYMPTOMS In the early stages of Alzheimer disease, you are still able to perform daily activities but need greater effort, more time, or memory aids. Early symptoms include:  Mild memory loss of recent events, names, or phone numbers.  Loss of objects.  Minor loss of vocabulary.  Difficulty with complex tasks, such as paying bills or driving in unfamiliar locations. Other mental functions deteriorate as the disease worsens. These changes slowly go from mild to severe. Symptoms at this stage include:  Difficulty remembering. You may not be  able to recall personal information such as your address and telephone number. You may become confused about the date, the season of the year, or your location.  Difficulty maintaining attention. You may forget what you wanted to say during conversations and repeat what you have already said.  Difficulty learning new information or tasks. You may not remember what you read or the name of a new friend you met.  Difficulty counting or doing math. You may have difficulty with complex math problems. You may make mistakes in paying bills or managing your checkbook.  Poor reasoning and judgment. You may make poor decisions or not dress right for the weather.  Difficulty communicating. You may have regular difficulty remembering words, naming objects, expressing yourself clearly, or writing sentences that make sense.  Difficulty performing familiar daily activities. You may get lost driving in familiar locations or need help eating, bathing, dressing, grooming, or using the toilet. You may have difficulty maintaining bladder or bowel control.  Difficulty recognizing familiar faces. You may confuse family members or close friends with one another. You may not recognize a close relative or may mistake strangers for family. Alzheimer disease also may cause changes in personality and behavior. These changes include:   Loss of interest or motivation.  Social withdrawal.  Anxiety.  Difficulty sleeping.  Uncharacteristic anger or combativeness.  A false belief that someone is trying to harm you (paranoia).  Seeing things that are not real (hallucinations).  Agitation. Confusion and disruptive behavior are often worse at night and may be triggered by changes in the environment or acute medical issues. DIAGNOSIS  Alzheimer disease is diagnosed through an assessment by your  health care provider. During this assessment, your health care provider will do the following:  Ask you and your family,  friends, or caregivers questions about your symptoms, their frequency, their duration and progression, and the effect they are having on your life.  Ask questions about your personal and family medical history and use of alcohol or drugs, including prescription medicine.  Perform a physical exam and order blood tests and brain imaging exams. Your health care provider may refer you to a specialist for detailed evaluation of your mental functions (neuropsychological testing).  Many different brain disorders, medical conditions, and certain substances can cause symptoms that resemble Alzheimer disease symptoms. These must be ruled out before this disease can be diagnosed. If Alzheimer disease is diagnosed, it will be considered either "possible" or "probable" Alzheimer disease. "Possible" Alzheimer disease means that your symptoms are typical of the disease and no other disorder is causing them. "Probable" Alzheimer disease means that you also have a family history of the disease or genetic test results that support the diagnosis. Certain tests, mostly used in research studies, are highly specific for Alzheimer disease.  TREATMENT  There is currently no cure for this disease. The goals of treatment are to:  Slow down the progression of the disease.  Preserve mental function as long as possible.  Manage behavioral symptoms.  Make life easier for the person with Alzheimer disease and his or her caregivers. The following treatment options are available:  Medicine. Certain medicines may help slow memory loss by changing the level of certain chemicals in the brain. Medicine may also help with behavioral symptoms.  Talk therapy. Talk therapy provides education, support, and memory aids for people with this disease. It is most effective in the early stages of the illness.  Caregiving. Caregivers may be family members, friends, or trained medical professionals. They help the person with Alzheimer disease  with daily life activities. Caregiving may take place at home or at a nursing facility.  Family support groups. These provide education, emotional support, and information about community resources to family members who are taking care of the person with this disease. Document Released: 12/01/2003 Document Revised: 08/05/2013 Document Reviewed: 07/27/2012 [facility] Patient Information 2015 [place], [place]. This information is not intended to replace advice given to you by your health care provider. Make sure you discuss any questions you have with your health care provider.

## 2014-11-17 ENCOUNTER — Ambulatory Visit: Payer: Medicare HMO | Admitting: Nurse Practitioner

## 2014-12-10 ENCOUNTER — Telehealth: Payer: Self-pay | Admitting: Neurology

## 2014-12-10 NOTE — Telephone Encounter (Signed)
I called the extended care facility. The patient has had increasing problems with behavior over the last week, okay to increase Zyprexa to 5 mg at night. I gave the verbal order.

## 2014-12-10 NOTE — Telephone Encounter (Signed)
I called [name]. She stated that since Dr. Jannifer Franklin started the patient on Zyprexa, she has been better. She still had her moments but for the past week she has been uncontrollable. She has been crying and having panic attacks. She yells at the other residents. They have not seen her consistently agitated like this since she started Zyprexa. [name] stated that the patient's son told her Dr. Jannifer Franklin had mentioned going up on the dosage if this medication, if need be. They would like to increased the dosage, if possible.

## 2014-12-10 NOTE — Telephone Encounter (Signed)
Phone Call from [facility] in [place] [phone] regarding Patient's Son requesting increase on OLANZapine (ZYPREXA) 2.5 MG tablet because she has had increase in anxiety, tearfulness, agitation over the past week.

## 2015-01-22 ENCOUNTER — Telehealth: Payer: Self-pay | Admitting: Neurology

## 2015-01-22 NOTE — Telephone Encounter (Signed)
I called the patient's son, Janet Knight. He stated that Ryan sent the patient to [facility] (12-17-14) d/t to her increased aggression, even after Zyprexa was increased to 5 mg. Since being there, the Zyprexa has been increased to 15 mg (according to [facility]). She is still aggressive and requiring prn medication. Janet Knight wondered if maybe a medication other than Zyprexa should be tried? He is also concerned that they are focusing on sending the patient to a special assistance medicaid facility. He feels like she may be better suited for a skilled nursing facility, and he also believes she may be accepted there easier. She has been denied to all special assistance facilities so far. Janet Knight would like to speak to Dr. Jannifer Franklin about this.

## 2015-01-22 NOTE — Telephone Encounter (Signed)
Pt's son called stating he is concerned about pt's aggression. She is at [facility] and facility is stating she is doing great. He feels she is not getting the best care. He is concerned if she is discharged would she be most suited for skilled Nursing Care or Assisted Living/Alzheimer facility. The facility states they have contacted [facility] and he is questioning if this is true. He is inquiring if OLANZapine (ZYPREXA) 5 MG tablet should be changed to higher dose or if another medication could be used. Please call and advise at [phone].

## 2015-01-22 NOTE — Telephone Encounter (Signed)
I called the son, left a message, I will call back later. 

## 2015-01-23 NOTE — Telephone Encounter (Signed)
I called the son. The patient has had ongoing issues with agitation, Zyprexa dose is been increased to 15 mg daily. They are trying to place the patient back into an extended care facility. I will try to call the hospital in [place], I will be unable to call in orders as I do not have privileges there. The number is [phone]. The patient is on the fourth floor, code #192. It is possible that a switch to another medication a be helpful. Abilify may be more effective. The patient has been on Risperdal previously, but she developed a gait disorder associated with secondary parkinsonism on this medication. The Risperdal was more effective for the agitation, however.  I called [facility]. I left my cell phone number with the nurse, the treating physician may call me at any time regarding this patient.

## 2015-01-31 ENCOUNTER — Encounter (HOSPITAL_COMMUNITY): Payer: Self-pay | Admitting: *Deleted

## 2015-01-31 ENCOUNTER — Emergency Department (HOSPITAL_COMMUNITY)
Admission: EM | Admit: 2015-01-31 | Discharge: 2015-02-01 | Disposition: A | Discharge status: Home / Self Care | Payer: Medicare HMO | Attending: Emergency Medicine | Admitting: Emergency Medicine

## 2015-01-31 ENCOUNTER — Emergency Department (HOSPITAL_COMMUNITY): Payer: Medicare HMO

## 2015-01-31 DIAGNOSIS — G309 Alzheimer's disease, unspecified: Secondary | ICD-10-CM | POA: Diagnosis not present

## 2015-01-31 DIAGNOSIS — Y998 Other external cause status: Secondary | ICD-10-CM | POA: Insufficient documentation

## 2015-01-31 DIAGNOSIS — Z85828 Personal history of other malignant neoplasm of skin: Secondary | ICD-10-CM | POA: Insufficient documentation

## 2015-01-31 DIAGNOSIS — W19XXXA Unspecified fall, initial encounter: Secondary | ICD-10-CM

## 2015-01-31 DIAGNOSIS — Y9389 Activity, other specified: Secondary | ICD-10-CM | POA: Diagnosis not present

## 2015-01-31 DIAGNOSIS — Z79899 Other long term (current) drug therapy: Secondary | ICD-10-CM | POA: Diagnosis not present

## 2015-01-31 DIAGNOSIS — Z7982 Long term (current) use of aspirin: Secondary | ICD-10-CM | POA: Insufficient documentation

## 2015-01-31 DIAGNOSIS — E039 Hypothyroidism, unspecified: Secondary | ICD-10-CM | POA: Insufficient documentation

## 2015-01-31 DIAGNOSIS — Z043 Encounter for examination and observation following other accident: Secondary | ICD-10-CM | POA: Insufficient documentation

## 2015-01-31 DIAGNOSIS — W1839XA Other fall on same level, initial encounter: Secondary | ICD-10-CM | POA: Insufficient documentation

## 2015-01-31 DIAGNOSIS — E785 Hyperlipidemia, unspecified: Secondary | ICD-10-CM | POA: Diagnosis not present

## 2015-01-31 DIAGNOSIS — Y9289 Other specified places as the place of occurrence of the external cause: Secondary | ICD-10-CM | POA: Insufficient documentation

## 2015-01-31 DIAGNOSIS — I10 Essential (primary) hypertension: Secondary | ICD-10-CM | POA: Diagnosis not present

## 2015-01-31 NOTE — ED Provider Notes (Signed)
CSN: [id number]     Arrival date & time 01/31/15  2149 History   First MD Initiated Contact with Patient 01/31/15 2210     Chief Complaint  Patient presents with  . Fall     (Consider location/radiation/quality/duration/timing/severity/associated sxs/prior Treatment) Patient is a 81 y.o. female presenting with fall. The history is limited by the condition of the patient.  Fall  Unable to obtain history due to patient's severe dementia.  History provided by EMS.  Janet Knight is a 81 y.o. female with PMH significant for HTN, dyslipidemia, and dementia who presents via EMS from [place] after an unwitnessed fall.  Per EMS, patient was found by facility on the ground after an unwitnessed fall.  It is the facility protocol to send patient's with dementia to ED.  EMS noted no injuries.  Patient is extremely confused which appears to be her baseline.   Past Medical History  Diagnosis Date  . Hypertension   . Dyslipidemia   . History of stress test 07/07/2009    was nonischemic  . Cancer ([place])     skin  . Hx of echocardiogram 11/2003    positive for anterior ischemia  . History of stress test 07/2009    normal myocardial perfusion study.  . Hypothyroid   . Alzheimer's disease 04/22/2014  . Falls    Past Surgical History  Procedure Laterality Date  . Cardiac catheterization  12/03/2003    was found to have normal coronary arteries and normal LV funtion   Family History  Problem Relation Age of Onset  . Heart attack Father 31  . Heart attack Brother   . Heart disease Brother   . Heart failure Mother   . Cancer Sister     liver  . Dementia Neg Hx    Social History  Substance Use Topics  . Smoking status: Never Smoker   . Smokeless tobacco: Never Used  . Alcohol Use: No   OB History    No data available     Review of Systems  Unable to perform ROS: Dementia      Allergies  Lipitor  Home Medications   Prior to Admission medications   Medication  Sig Start Date End Date Taking? Authorizing Provider  acetaminophen (TYLENOL) 500 MG tablet Take 1,000 mg by mouth every 6 (six) hours as needed.    Historical Provider, MD  ALPRAZolam Duanne Moron) 0.5 MG tablet Take 0.5 mg by mouth 2 (two) times daily as needed for anxiety.    Historical Provider, MD  alum & mag hydroxide-simeth (MAALOX/MYLANTA) 200-200-20 MG/5ML suspension Take 30 mLs by mouth every 6 (six) hours as needed for indigestion or heartburn.    Historical Provider, MD  amLODipine (NORVASC) 5 MG tablet Take 5 mg by mouth daily.    Historical Provider, MD  aspirin 325 MG tablet Take 325 mg by mouth daily.    Historical Provider, MD  aspirin 81 MG tablet Take 81 mg by mouth daily.    Historical Provider, MD  calcium citrate-vitamin D (CITRACAL+D) 315-200 MG-UNIT per tablet Take 1 tablet by mouth 2 (two) times daily.    Historical Provider, MD  cholecalciferol (VITAMIN D) 1000 UNITS tablet Take 2,000 Units by mouth daily.    Historical Provider, MD  Cyanocobalamin (VITAMIN B 12 PO) Take 1,000 Units by mouth daily.    Historical Provider, MD  donepezil (ARICEPT) 10 MG tablet Take 10 mg by mouth at bedtime as needed.    Historical Provider, MD  ezetimibe (  ZETIA) 10 MG tablet Take 1 tablet (10 mg total) by mouth daily. NEED APPOINTMENT FOR FUTURE REFILLS. 03/12/14   Lorretta Harp, MD  guaiFENesin (ROBITUSSIN) 100 MG/5ML liquid Take 200 mg by mouth 3 (three) times daily as needed for cough.    Historical Provider, MD  hydrochlorothiazide (MICROZIDE) 12.5 MG capsule TAKE 1 CAPSULE BY MOUTH DAILY. 06/13/14   Lorretta Harp, MD  levothyroxine (SYNTHROID, LEVOTHROID) 50 MCG tablet Take 50 mcg by mouth daily before breakfast.    Historical Provider, MD  loperamide (IMODIUM) 2 MG capsule Take 2 mg by mouth as needed for diarrhea or loose stools.    Historical Provider, MD  metoprolol tartrate (LOPRESSOR) 25 MG tablet Take 25 mg by mouth 2 (two) times daily.    Historical Provider, MD  Multiple  Vitamins-Minerals (MULTIVITAMIN WITH IRON-MINERALS) liquid Take by mouth daily.    Historical Provider, MD  OLANZapine (ZYPREXA) 5 MG tablet Take 5 mg by mouth at bedtime.    Historical Provider, MD  potassium chloride SA (K-DUR,KLOR-CON) 20 MEQ tablet Take 20 mEq by mouth 2 (two) times daily.    Historical Provider, MD  sertraline (ZOLOFT) 50 MG tablet TAKE 1 TABLET BY MOUTH DAILY. 06/12/14   Kathrynn Ducking, MD  simvastatin (ZOCOR) 20 MG tablet Take 20 mg by mouth every evening.    Historical Provider, MD   BP 140/100 mmHg  Pulse 74  Temp(Src) 98.2 F (36.8 C) (Oral)  Resp 18  SpO2 100% Physical Exam  Constitutional: She appears well-developed and well-nourished.  HENT:  Head: Normocephalic and atraumatic.  Mouth/Throat: Oropharynx is clear and moist.  Eyes: Conjunctivae are normal. Pupils are equal, round, and reactive to light.  Neck: Normal range of motion. Neck supple.  No cervical midline tenderness.   Cardiovascular: Normal rate, regular rhythm, normal heart sounds and intact distal pulses.   No murmur heard. Pulmonary/Chest: Effort normal and breath sounds normal. No accessory muscle usage or stridor. No respiratory distress. She has no wheezes. She has no rhonchi. She has no rales.  Abdominal: Soft. Bowel sounds are normal. She exhibits no distension. There is no tenderness.  Musculoskeletal: Normal range of motion.  Lymphadenopathy:    She has no cervical adenopathy.  Neurological: She is alert.  Speech clear without dysarthria.  Skin: Skin is warm and dry.  No signs of trauma.   Psychiatric: She has a normal mood and affect. Her behavior is normal.    ED Course  Procedures (including critical care time) Labs Review Labs Reviewed - No data to display  Imaging Review Ct Head Wo Contrast  01/31/2015  CLINICAL DATA:  Unwitnessed fall EXAM: CT HEAD WITHOUT CONTRAST CT CERVICAL SPINE WITHOUT CONTRAST TECHNIQUE: Multidetector CT imaging of the head and cervical spine  was performed following the standard protocol without intravenous contrast. Multiplanar CT image reconstructions of the cervical spine were also generated. COMPARISON:  Head CT dated 05/29/2014. FINDINGS: CT HEAD FINDINGS Again noted is generalized brain atrophy with commensurate dilatation of the ventricles and sulci. Chronic small vessel ischemic changes again noted within the bilateral periventricular white matter regions. Small old lacunar infarcts again noted within the basal ganglia. There is no mass, hemorrhage, edema, or other evidence of acute parenchymal abnormality. No extra-axial hemorrhage. No osseous fracture or dislocation. CT CERVICAL SPINE FINDINGS Mild degenerative change noted within the mid and lower cervical spine with slight disc space narrowings and minimal osseous spurring. Associated mild disc bulges at multiple levels are causing mild central canal stenoses.  Cervical spine alignment is normal. No fracture line or displaced fracture fragment identified. Facet joints are well aligned throughout. Atherosclerotic calcifications noted at the left carotid bulb. Scarring/fibrosis noted at each lung apex. Paravertebral soft tissues otherwise unremarkable. IMPRESSION: 1. No evidence of acute intracranial abnormality. No intracranial mass, hemorrhage, or edema. Atrophy and chronic ischemic changes as detailed above. 2. No fracture or acute subluxation within the cervical spine. Chronic/degenerative changes detailed above. Electronically Signed   By: Franki Cabot M.D.   On: 01/31/2015 22:59   Ct Cervical Spine Wo Contrast  01/31/2015  CLINICAL DATA:  Unwitnessed fall EXAM: CT HEAD WITHOUT CONTRAST CT CERVICAL SPINE WITHOUT CONTRAST TECHNIQUE: Multidetector CT imaging of the head and cervical spine was performed following the standard protocol without intravenous contrast. Multiplanar CT image reconstructions of the cervical spine were also generated. COMPARISON:  Head CT dated 05/29/2014.  FINDINGS: CT HEAD FINDINGS Again noted is generalized brain atrophy with commensurate dilatation of the ventricles and sulci. Chronic small vessel ischemic changes again noted within the bilateral periventricular white matter regions. Small old lacunar infarcts again noted within the basal ganglia. There is no mass, hemorrhage, edema, or other evidence of acute parenchymal abnormality. No extra-axial hemorrhage. No osseous fracture or dislocation. CT CERVICAL SPINE FINDINGS Mild degenerative change noted within the mid and lower cervical spine with slight disc space narrowings and minimal osseous spurring. Associated mild disc bulges at multiple levels are causing mild central canal stenoses. Cervical spine alignment is normal. No fracture line or displaced fracture fragment identified. Facet joints are well aligned throughout. Atherosclerotic calcifications noted at the left carotid bulb. Scarring/fibrosis noted at each lung apex. Paravertebral soft tissues otherwise unremarkable. IMPRESSION: 1. No evidence of acute intracranial abnormality. No intracranial mass, hemorrhage, or edema. Atrophy and chronic ischemic changes as detailed above. 2. No fracture or acute subluxation within the cervical spine. Chronic/degenerative changes detailed above. Electronically Signed   By: Franki Cabot M.D.   On: 01/31/2015 22:59   I have personally reviewed and evaluated these images and lab results as part of my medical decision-making.   EKG Interpretation None      MDM   Final diagnoses:  Fall, initial encounter    Patient presents via EMS after unwitnessed fall at [facility].  Facility protocol to send dementia patients after fall to ED.  No injuries noted per EMS.  VSS, NAD.  Patient alert, not oriented to person, place, or time.  Unable to obtain any history due to patient's dementia.  On exam, no signs of trauma.  Heart RRR, lungs CTAB, abdomen soft, nontender.  Distal pulses intact. Will obtain head CT  and cervical spine.  No other imaging or labs indicated at this time.   CT cervical spine and head show no evidence of fracture, intracranial abnormality, or subluxation or fracture of cervical spine.  Patient stable for discharge.  VSS, NAD.    Case has been discussed with and seen by Dr. Dayna Barker who agrees with the above plan for discharge.        Janet Loan, PA-C 01/31/15 2340  Merrily Pew, MD 02/01/15 [phone]

## 2015-01-31 NOTE — ED Notes (Signed)
[facility] called  

## 2015-01-31 NOTE — ED Notes (Signed)
Pt is very confused as per her norm,  Bed in lowest position,  Warm blankets applied,  Frequent checks , lights dimmed

## 2015-01-31 NOTE — ED Provider Notes (Signed)
Medical screening examination/treatment/procedure(s) were conducted as a shared visit with non-physician practitioner(s) and myself.  I personally evaluated the patient during the encounter.  Unwitnessed fall. Severe dementia, does not provide history. Exam without evidence of significant trauma. Awakes to answer questions. No lacerations or bony ttp. Plan to ct head/c spine and dc to faciliyt if normal.   Merrily Pew, MD 02/01/15 [phone]

## 2015-01-31 NOTE — ED Notes (Signed)
Pt is sleeping rr 18.

## 2015-01-31 NOTE — ED Notes (Signed)
Pt arrived via EMS from [facility],  She was an unwitnessed fall,  No injuries noted,  It is protocol to send dementia patients to hospital to be evaluated

## 2015-01-31 NOTE — Discharge Instructions (Signed)
Fall Prevention in the Home   Falls can cause injuries. They can happen to people of all ages. There are many things you can do to make your home safe and to help prevent falls.   WHAT CAN I DO ON THE OUTSIDE OF MY HOME?  · Regularly fix the edges of walkways and driveways and fix any cracks.  · Remove anything that might make you trip as you walk through a door, such as a raised step or threshold.  · Trim any bushes or trees on the path to your home.  · Use bright outdoor lighting.  · Clear any walking paths of anything that might make someone trip, such as rocks or tools.  · Regularly check to see if handrails are loose or broken. Make sure that both sides of any steps have handrails.  · Any raised decks and porches should have guardrails on the edges.  · Have any leaves, snow, or ice cleared regularly.  · Use sand or salt on walking paths during winter.  · Clean up any spills in your garage right away. This includes oil or grease spills.  WHAT CAN I DO IN THE BATHROOM?   · Use night lights.  · Install grab bars by the toilet and in the tub and shower. Do not use towel bars as grab bars.  · Use non-skid mats or decals in the tub or shower.  · If you need to sit down in the shower, use a plastic, non-slip stool.  · Keep the floor dry. Clean up any water that spills on the floor as soon as it happens.  · Remove soap buildup in the tub or shower regularly.  · Attach bath mats securely with double-sided non-slip rug tape.  · Do not have throw rugs and other things on the floor that can make you trip.  WHAT CAN I DO IN THE BEDROOM?  · Use night lights.  · Make sure that you have a light by your bed that is easy to reach.  · Do not use any sheets or blankets that are too big for your bed. They should not hang down onto the floor.  · Have a firm chair that has side arms. You can use this for support while you get dressed.  · Do not have throw rugs and other things on the floor that can make you trip.  WHAT CAN I DO IN  THE KITCHEN?  · Clean up any spills right away.  · Avoid walking on wet floors.  · Keep items that you use a lot in easy-to-reach places.  · If you need to reach something above you, use a strong step stool that has a grab bar.  · Keep electrical cords out of the way.  · Do not use floor polish or wax that makes floors slippery. If you must use wax, use non-skid floor wax.  · Do not have throw rugs and other things on the floor that can make you trip.  WHAT CAN I DO WITH MY STAIRS?  · Do not leave any items on the stairs.  · Make sure that there are handrails on both sides of the stairs and use them. Fix handrails that are broken or loose. Make sure that handrails are as long as the stairways.  · Check any carpeting to make sure that it is firmly attached to the stairs. Fix any carpet that is loose or worn.  · Avoid having throw rugs at the top   or bottom of the stairs. If you do have throw rugs, attach them to the floor with carpet tape.  · Make sure that you have a light switch at the top of the stairs and the bottom of the stairs. If you do not have them, ask someone to add them for you.  WHAT ELSE CAN I DO TO HELP PREVENT FALLS?  · Wear shoes that:    Do not have high heels.    Have rubber bottoms.    Are comfortable and fit you well.    Are closed at the toe. Do not wear sandals.  · If you use a stepladder:    Make sure that it is fully opened. Do not climb a closed stepladder.    Make sure that both sides of the stepladder are locked into place.    Ask someone to hold it for you, if possible.  · Clearly mark and make sure that you can see:    Any grab bars or handrails.    First and last steps.    Where the edge of each step is.  · Use tools that help you move around (mobility aids) if they are needed. These include:    Canes.    Walkers.    Scooters.    Crutches.  · Turn on the lights when you go into a dark area. Replace any light bulbs as soon as they burn out.  · Set up your furniture so you have a clear  path. Avoid moving your furniture around.  · If any of your floors are uneven, fix them.  · If there are any pets around you, be aware of where they are.  · Review your medicines with your doctor. Some medicines can make you feel dizzy. This can increase your chance of falling.  Ask your doctor what other things that you can do to help prevent falls.     This information is not intended to replace advice given to you by your health care provider. Make sure you discuss any questions you have with your health care provider.     Document Released: 01/15/2009 Document Revised: 08/05/2014 Document Reviewed: 04/25/2014  Elsevier Interactive Patient Education ©2016 [employer]

## 2015-01-31 NOTE — ED Notes (Signed)
Bed: [facility] Expected date:  Expected time:  Means of arrival:  Comments: EMS fall from SNF

## 2015-02-01 NOTE — ED Notes (Signed)
Edit information for discharge,  Typed on wrong patient

## 2015-02-12 ENCOUNTER — Ambulatory Visit: Payer: Self-pay | Admitting: Neurology

## 2015-02-13 ENCOUNTER — Encounter: Payer: Self-pay | Admitting: Neurology

## 2015-03-20 ENCOUNTER — Emergency Department (HOSPITAL_COMMUNITY)
Admission: EM | Admit: 2015-03-20 | Discharge: 2015-03-20 | Disposition: A | Discharge status: Home / Self Care | Payer: Medicare HMO | Attending: Emergency Medicine | Admitting: Emergency Medicine

## 2015-03-20 ENCOUNTER — Encounter (HOSPITAL_COMMUNITY): Payer: Self-pay | Admitting: Emergency Medicine

## 2015-03-20 ENCOUNTER — Emergency Department (HOSPITAL_COMMUNITY): Payer: Medicare HMO

## 2015-03-20 DIAGNOSIS — R300 Dysuria: Secondary | ICD-10-CM | POA: Diagnosis present

## 2015-03-20 DIAGNOSIS — F0280 Dementia in other diseases classified elsewhere without behavioral disturbance: Secondary | ICD-10-CM | POA: Insufficient documentation

## 2015-03-20 DIAGNOSIS — I10 Essential (primary) hypertension: Secondary | ICD-10-CM | POA: Diagnosis not present

## 2015-03-20 DIAGNOSIS — Z7982 Long term (current) use of aspirin: Secondary | ICD-10-CM | POA: Insufficient documentation

## 2015-03-20 DIAGNOSIS — R451 Restlessness and agitation: Secondary | ICD-10-CM | POA: Insufficient documentation

## 2015-03-20 DIAGNOSIS — J159 Unspecified bacterial pneumonia: Secondary | ICD-10-CM | POA: Insufficient documentation

## 2015-03-20 DIAGNOSIS — Z859 Personal history of malignant neoplasm, unspecified: Secondary | ICD-10-CM | POA: Diagnosis not present

## 2015-03-20 DIAGNOSIS — E785 Hyperlipidemia, unspecified: Secondary | ICD-10-CM | POA: Insufficient documentation

## 2015-03-20 DIAGNOSIS — R0602 Shortness of breath: Secondary | ICD-10-CM

## 2015-03-20 DIAGNOSIS — Z9889 Other specified postprocedural states: Secondary | ICD-10-CM | POA: Diagnosis not present

## 2015-03-20 DIAGNOSIS — Z79899 Other long term (current) drug therapy: Secondary | ICD-10-CM | POA: Diagnosis not present

## 2015-03-20 DIAGNOSIS — F419 Anxiety disorder, unspecified: Secondary | ICD-10-CM | POA: Diagnosis not present

## 2015-03-20 DIAGNOSIS — G309 Alzheimer's disease, unspecified: Secondary | ICD-10-CM | POA: Diagnosis not present

## 2015-03-20 DIAGNOSIS — E039 Hypothyroidism, unspecified: Secondary | ICD-10-CM | POA: Insufficient documentation

## 2015-03-20 DIAGNOSIS — J189 Pneumonia, unspecified organism: Secondary | ICD-10-CM

## 2015-03-20 LAB — COMPREHENSIVE METABOLIC PANEL
ALBUMIN: 3.7 g/dL (ref 3.5–5.0)
ALK PHOS: 63 U/L (ref 38–126)
ALT: 15 U/L (ref 14–54)
AST: 24 U/L (ref 15–41)
Anion gap: 11 (ref 5–15)
BILIRUBIN TOTAL: 0.5 mg/dL (ref 0.3–1.2)
BUN: 24 mg/dL — AB (ref 6–20)
CO2: 26 mmol/L (ref 22–32)
Calcium: 9.6 mg/dL (ref 8.9–10.3)
Chloride: 105 mmol/L (ref 101–111)
Creatinine, Ser: 0.96 mg/dL (ref 0.44–1.00)
GFR calc Af Amer: >60 mL/min (ref >60)
GFR calc non Af Amer: 55 mL/min — ABNORMAL LOW (ref >60)
GLUCOSE: 120 mg/dL — AB (ref 65–99)
Potassium: 4.4 mmol/L (ref 3.5–5.1)
Sodium: 142 mmol/L (ref 135–145)
TOTAL PROTEIN: 7.2 g/dL (ref 6.5–8.1)

## 2015-03-20 LAB — URINALYSIS, ROUTINE W REFLEX MICROSCOPIC
Bilirubin Urine: NEGATIVE
Glucose, UA: NEGATIVE mg/dL
Hgb urine dipstick: NEGATIVE
KETONES UR: NEGATIVE mg/dL
LEUKOCYTES UA: NEGATIVE
NITRITE: NEGATIVE
PH: 6 (ref 5.0–8.0)
PROTEIN: NEGATIVE mg/dL
Specific Gravity, Urine: 1.027 (ref 1.005–1.030)

## 2015-03-20 LAB — CBC WITH DIFFERENTIAL/PLATELET
BASOS ABS: 0 10*3/uL (ref 0.0–0.1)
BASOS PCT: 0 %
Eosinophils Absolute: 0 10*3/uL (ref 0.0–0.7)
Eosinophils Relative: 0 %
HEMATOCRIT: 39.6 % (ref 36.0–46.0)
HEMOGLOBIN: 12.7 g/dL (ref 12.0–15.0)
Lymphocytes Relative: 11 %
Lymphs Abs: 1.2 10*3/uL (ref 0.7–4.0)
MCH: 30.8 pg (ref 26.0–34.0)
MCHC: 32.1 g/dL (ref 30.0–36.0)
MCV: 95.9 fL (ref 78.0–100.0)
MONOS PCT: 9 %
Monocytes Absolute: 0.9 10*3/uL (ref 0.1–1.0)
NEUTROS ABS: 8.2 10*3/uL — AB (ref 1.7–7.7)
NEUTROS PCT: 80 %
Platelets: 265 10*3/uL (ref 150–400)
RBC: 4.13 MIL/uL (ref 3.87–5.11)
RDW: 14.5 % (ref 11.5–15.5)
WBC: 10.2 10*3/uL (ref 4.0–10.5)

## 2015-03-20 LAB — I-STAT CG4 LACTIC ACID, ED: Lactic Acid, Venous: 2.79 mmol/L (ref 0.5–2.0)

## 2015-03-20 MED ORDER — AZITHROMYCIN 250 MG PO TABS
250.0000 mg | ORAL_TABLET | Freq: Every day | ORAL | Status: DC
  Administered 2015-03-20: 250 mg via ORAL
  Filled 2015-03-20: qty 1

## 2015-03-20 MED ORDER — AZITHROMYCIN 250 MG PO TABS: 250.0000 mg | ORAL_TABLET | Freq: Every day | ORAL | Status: DC

## 2015-03-20 MED ORDER — DEXTROSE 5 % IV SOLN
1.0000 g | INTRAVENOUS | Status: DC
  Administered 2015-03-20: 1 g via INTRAVENOUS
  Filled 2015-03-20: qty 10

## 2015-03-20 NOTE — ED Provider Notes (Signed)
CSN: [id number]     Arrival date & time 03/20/15  1109 History   First MD Initiated Contact with Patient 03/20/15 1126     Chief Complaint  Patient presents with  . Dysuria     (Consider location/radiation/quality/duration/timing/severity/associated sxs/prior Treatment) HPI Comments: Patient here with history of dementia and here with trouble urinating as well as possible altered mental status. On arrival here, patient is alert to person. She is very active. Was noted that she did urinate at the scene in front of EMS prior to traveling. No reported vomiting, fever. Patient has required redirection. No reported cough or congestion. No history obtainable due to her current state  Patient is a 81 y.o. female presenting with dysuria. The history is provided by the patient. The history is limited by the condition of the patient.  Dysuria   Past Medical History  Diagnosis Date  . Hypertension   . Dyslipidemia   . History of stress test 07/07/2009    was nonischemic  . Cancer ([place])     skin  . Hx of echocardiogram 11/2003    positive for anterior ischemia  . History of stress test 07/2009    normal myocardial perfusion study.  . Hypothyroid   . Alzheimer's disease 04/22/2014  . Falls    Past Surgical History  Procedure Laterality Date  . Cardiac catheterization  12/03/2003    was found to have normal coronary arteries and normal LV funtion   Family History  Problem Relation Age of Onset  . Heart attack Father 10  . Heart attack Brother   . Heart disease Brother   . Heart failure Mother   . Cancer Sister     liver  . Dementia Neg Hx    Social History  Substance Use Topics  . Smoking status: Never Smoker   . Smokeless tobacco: Never Used  . Alcohol Use: No   OB History    No data available     Review of Systems  Unable to perform ROS: Dementia  Genitourinary: Positive for dysuria.      Allergies  Lipitor  Home Medications   Prior to Admission medications    Medication Sig Start Date End Date Taking? Authorizing Provider  aspirin 81 MG chewable tablet Chew 81 mg by mouth daily.    Historical Provider, MD  cholecalciferol (VITAMIN D) 1000 UNITS tablet Take 2,000 Units by mouth every morning. 01/29/15 01/29/16  Historical Provider, MD  cyanocobalamin 1000 MCG tablet Take 1,000 mcg by mouth every morning.    Historical Provider, MD  divalproex (DEPAKOTE SPRINKLE) 125 MG capsule Take 250 mg by mouth 3 (three) times daily with meals. 01/29/15 01/29/16  Historical Provider, MD  donepezil (ARICEPT) 10 MG tablet Take 10 mg by mouth at bedtime.     Historical Provider, MD  ezetimibe (ZETIA) 10 MG tablet Take 1 tablet (10 mg total) by mouth daily. NEED APPOINTMENT FOR FUTURE REFILLS. 03/12/14   Lorretta Harp, MD  hydrochlorothiazide (MICROZIDE) 12.5 MG capsule TAKE 1 CAPSULE BY MOUTH DAILY. Patient not taking: Reported on 01/31/2015 06/13/14   Lorretta Harp, MD  levothyroxine (SYNTHROID, LEVOTHROID) 50 MCG tablet Take 50 mcg by mouth daily before breakfast.    Historical Provider, MD  Melatonin 3 MG TABS Take 9 mg by mouth at bedtime. 01/29/15   Historical Provider, MD  metoprolol tartrate (LOPRESSOR) 25 MG tablet Take 25 mg by mouth 2 (two) times daily.    Historical Provider, MD  Multiple Vitamin (THERA) TABS Take  1 tablet by mouth every morning. 01/29/15   Historical Provider, MD  sertraline (ZOLOFT) 50 MG tablet TAKE 1 TABLET BY MOUTH DAILY. Patient not taking: Reported on 01/31/2015 06/12/14   Kathrynn Ducking, MD  tamsulosin (FLOMAX) 0.4 MG CAPS capsule Take 0.4 mg by mouth 2 (two) times daily. 01/29/15   Historical Provider, MD  traZODone (DESYREL) 50 MG tablet Take 50 mg by mouth daily as needed for sleep. sleep 01/29/15 02/28/15  Historical Provider, MD   BP 127/91 mmHg  Pulse 75  Temp(Src) 99.4 F (37.4 C) (Axillary)  Resp 16  SpO2 99% Physical Exam  Constitutional: She is oriented to person, place, and time. She appears cachectic.   Non-toxic appearance. No distress.  HENT:  Head: Normocephalic and atraumatic.  Eyes: Conjunctivae, EOM and lids are normal. Pupils are equal, round, and reactive to light.  Neck: Normal range of motion. Neck supple. No tracheal deviation present. No thyroid mass present.  Cardiovascular: Normal rate, regular rhythm and normal heart sounds.  Exam reveals no gallop.   No murmur heard. Pulmonary/Chest: Effort normal and breath sounds normal. No stridor. No respiratory distress. She has no decreased breath sounds. She has no wheezes. She has no rhonchi. She has no rales.  Abdominal: Soft. Normal appearance and bowel sounds are normal. She exhibits no distension. There is no tenderness. There is no rebound and no CVA tenderness.  Musculoskeletal: Normal range of motion. She exhibits no edema or tenderness.  Neurological: She is alert and oriented to person, place, and time. She has normal strength. No cranial nerve deficit or sensory deficit. GCS eye subscore is 4. GCS verbal subscore is 5. GCS motor subscore is 6.  Skin: Skin is warm and dry. No abrasion and no rash noted.  Psychiatric: Her mood appears anxious. She is agitated.  Nursing note and vitals reviewed.   ED Course  Procedures (including critical care time) Labs Review Labs Reviewed  URINE CULTURE  CULTURE, BLOOD (ROUTINE X 2)  CULTURE, BLOOD (ROUTINE X 2)  URINALYSIS, ROUTINE W REFLEX MICROSCOPIC (NOT AT [facility])  VALPROIC ACID LEVEL  CBC WITH DIFFERENTIAL/PLATELET  COMPREHENSIVE METABOLIC PANEL  I-STAT CG4 LACTIC ACID, ED    Imaging Review No results found. I have personally reviewed and evaluated these images and lab results as part of my medical decision-making.   EKG Interpretation None      MDM   Final diagnoses:  SOB (shortness of breath)    Will tx pt for possible pna and send follow-up instructions back to nursing home    Lacretia Leigh, MD 03/20/15 1553

## 2015-03-20 NOTE — ED Notes (Signed)
Per EMS. Pt from [facility]. Hx dementia. Oriented per norm, calling everyone "dad" and swearing whenever she is bothered. Nursing home staff told EMS she has been having difficulty urinating, urinated upon EMS arrival to facility. Staff also told EMS she had been complaining of back pain, however she denies pain at present and on EMS assessment.

## 2015-03-20 NOTE — ED Notes (Signed)
Patient transported to X-ray 

## 2015-03-20 NOTE — ED Notes (Signed)
Bed: [facility] Expected date:  Expected time:  Means of arrival:  Comments: 

## 2015-03-20 NOTE — ED Notes (Signed)
Bed: [facility] Expected date:  Expected time:  Means of arrival:  Comments: 

## 2015-03-20 NOTE — Discharge Instructions (Signed)
Is patient will need to have a repeat chest x-ray in 3-4 weeks following trial of antibiotic therapy to ensure resolution and exclude underlying malignancy. Community-Acquired Pneumonia, Adult Pneumonia is an infection of the lungs. There are different types of pneumonia. One type can develop while a person is in a hospital. A different type, called community-acquired pneumonia, develops in people who are not, or have not recently been, in the hospital or other health care facility.  CAUSES Pneumonia may be caused by bacteria, viruses, or funguses. Community-acquired pneumonia is often caused by Streptococcus pneumonia bacteria. These bacteria are often passed from one person to another by breathing in droplets from the cough or sneeze of an infected person. RISK FACTORS The condition is more likely to develop in:  People who havechronic diseases, such as chronic obstructive pulmonary disease (COPD), asthma, congestive heart failure, cystic fibrosis, diabetes, or kidney disease.  People who haveearly-stage or late-stage HIV.  People who havesickle cell disease.  People who havehad their spleen removed (splenectomy).  People who havepoor Human resources officer.  People who havemedical conditions that increase the risk of breathing in (aspirating) secretions their own mouth and nose.   People who havea weakened immune system (immunocompromised).  People who smoke.  People whotravel to areas where pneumonia-causing germs commonly exist.  People whoare around animal habitats or animals that have pneumonia-causing germs, including birds, bats, rabbits, cats, and farm animals. SYMPTOMS Symptoms of this condition include:  Adry cough.  A wet (productive) cough.  Fever.  Sweating.  Chest pain, especially when breathing deeply or coughing.  Rapid breathing or difficulty breathing.  Shortness of breath.  Shaking chills.  Fatigue.  Muscle aches. DIAGNOSIS Your health care  provider will take a medical history and perform a physical exam. You may also have other tests, including:  Imaging studies of your chest, including X-rays.  Tests to check your blood oxygen level and other blood gases.  Other tests on blood, mucus (sputum), fluid around your lungs (pleural fluid), and urine. If your pneumonia is severe, other tests may be done to identify the specific cause of your illness. TREATMENT The type of treatment that you receive depends on many factors, such as the cause of your pneumonia, the medicines you take, and other medical conditions that you have. For most adults, treatment and recovery from pneumonia may occur at home. In some cases, treatment must happen in a hospital. Treatment may include:  Antibiotic medicines, if the pneumonia was caused by bacteria.  Antiviral medicines, if the pneumonia was caused by a virus.  Medicines that are given by mouth or through an IV tube.  Oxygen.  Respiratory therapy. Although rare, treating severe pneumonia may include:  Mechanical ventilation. This is done if you are not breathing well on your own and you cannot maintain a safe blood oxygen level.  Thoracentesis. This procedureremoves fluid around one lung or both lungs to help you breathe better. HOME CARE INSTRUCTIONS  Take over-the-counter and prescription medicines only as told by your health care provider.  Only takecough medicine if you are losing sleep. Understand that cough medicine can prevent your body's natural ability to remove mucus from your lungs.  If you were prescribed an antibiotic medicine, take it as told by your health care provider. Do not stop taking the antibiotic even if you start to feel better.  Sleep in a semi-upright position at night. Try sleeping in a reclining chair, or place a few pillows under your head.  Do not use tobacco  products, including cigarettes, chewing tobacco, and e-cigarettes. If you need help quitting, ask  your health care provider.  Drink enough water to keep your urine clear or pale yellow. This will help to thin out mucus secretions in your lungs. PREVENTION There are ways that you can decrease your risk of developing community-acquired pneumonia. Consider getting a pneumococcal vaccine if:  You are older than 79 years of age.  You are older than 79 years of age and are undergoing cancer treatment, have chronic lung disease, or have other medical conditions that affect your immune system. Ask your health care provider if this applies to you. There are different types and schedules of pneumococcal vaccines. Ask your health care provider which vaccination option is best for you. You may also prevent community-acquired pneumonia if you take these actions:  Get an influenza vaccine every year. Ask your health care provider which type of influenza vaccine is best for you.  Go to the dentist on a regular basis.  Wash your hands often. Use hand sanitizer if soap and water are not available. SEEK MEDICAL CARE IF:  You have a fever.  You are losing sleep because you cannot control your cough with cough medicine. SEEK IMMEDIATE MEDICAL CARE IF:  You have worsening shortness of breath.  You have increased chest pain.  Your sickness becomes worse, especially if you are an older adult or have a weakened immune system.  You cough up blood.   This information is not intended to replace advice given to you by your health care provider. Make sure you discuss any questions you have with your health care provider.   Document Released: 03/21/2005 Document Revised: 12/10/2014 Document Reviewed: 07/16/2014 Elsevier Interactive Patient Education [employer].

## 2015-03-21 LAB — URINE CULTURE: Culture: NO GROWTH

## 2015-03-25 LAB — CULTURE, BLOOD (ROUTINE X 2)
CULTURE: NO GROWTH
CULTURE: NO GROWTH

## 2015-04-27 ENCOUNTER — Emergency Department (HOSPITAL_COMMUNITY)
Admission: EM | Admit: 2015-04-27 | Discharge: 2015-04-27 | Disposition: A | Discharge status: Home / Self Care | Payer: Medicare HMO | Attending: Emergency Medicine | Admitting: Emergency Medicine

## 2015-04-27 ENCOUNTER — Encounter (HOSPITAL_COMMUNITY): Payer: Self-pay | Admitting: Emergency Medicine

## 2015-04-27 ENCOUNTER — Emergency Department (HOSPITAL_COMMUNITY): Payer: Medicare HMO

## 2015-04-27 DIAGNOSIS — Z85828 Personal history of other malignant neoplasm of skin: Secondary | ICD-10-CM | POA: Diagnosis not present

## 2015-04-27 DIAGNOSIS — F0280 Dementia in other diseases classified elsewhere without behavioral disturbance: Secondary | ICD-10-CM | POA: Diagnosis not present

## 2015-04-27 DIAGNOSIS — Y9389 Activity, other specified: Secondary | ICD-10-CM | POA: Insufficient documentation

## 2015-04-27 DIAGNOSIS — W1839XA Other fall on same level, initial encounter: Secondary | ICD-10-CM | POA: Insufficient documentation

## 2015-04-27 DIAGNOSIS — Y9289 Other specified places as the place of occurrence of the external cause: Secondary | ICD-10-CM | POA: Diagnosis not present

## 2015-04-27 DIAGNOSIS — G309 Alzheimer's disease, unspecified: Secondary | ICD-10-CM | POA: Insufficient documentation

## 2015-04-27 DIAGNOSIS — Y998 Other external cause status: Secondary | ICD-10-CM | POA: Insufficient documentation

## 2015-04-27 DIAGNOSIS — Z8639 Personal history of other endocrine, nutritional and metabolic disease: Secondary | ICD-10-CM | POA: Diagnosis not present

## 2015-04-27 DIAGNOSIS — Z043 Encounter for examination and observation following other accident: Secondary | ICD-10-CM | POA: Diagnosis not present

## 2015-04-27 DIAGNOSIS — Z9889 Other specified postprocedural states: Secondary | ICD-10-CM | POA: Diagnosis not present

## 2015-04-27 DIAGNOSIS — Z79899 Other long term (current) drug therapy: Secondary | ICD-10-CM | POA: Insufficient documentation

## 2015-04-27 DIAGNOSIS — I10 Essential (primary) hypertension: Secondary | ICD-10-CM | POA: Diagnosis not present

## 2015-04-27 DIAGNOSIS — W19XXXA Unspecified fall, initial encounter: Secondary | ICD-10-CM

## 2015-04-27 DIAGNOSIS — Z7982 Long term (current) use of aspirin: Secondary | ICD-10-CM | POA: Diagnosis not present

## 2015-04-27 DIAGNOSIS — E039 Hypothyroidism, unspecified: Secondary | ICD-10-CM | POA: Diagnosis not present

## 2015-04-27 LAB — BASIC METABOLIC PANEL
Anion gap: 10 (ref 5–15)
BUN: 23 mg/dL — ABNORMAL HIGH (ref 6–20)
CO2: 27 mmol/L (ref 22–32)
Calcium: 9 mg/dL (ref 8.9–10.3)
Chloride: 106 mmol/L (ref 101–111)
Creatinine, Ser: 0.83 mg/dL (ref 0.44–1.00)
GFR calc Af Amer: >60 mL/min (ref >60)
GFR calc non Af Amer: >60 mL/min (ref >60)
Glucose, Bld: 92 mg/dL (ref 65–99)
Potassium: 4.1 mmol/L (ref 3.5–5.1)
Sodium: 143 mmol/L (ref 135–145)

## 2015-04-27 LAB — CBC WITH DIFFERENTIAL/PLATELET
Basophils Absolute: 0 10*3/uL (ref 0.0–0.1)
Basophils Relative: 1 %
Eosinophils Absolute: 0.4 10*3/uL (ref 0.0–0.7)
Eosinophils Relative: 6 %
HCT: 36.6 % (ref 36.0–46.0)
Hemoglobin: 11.5 g/dL — ABNORMAL LOW (ref 12.0–15.0)
Lymphocytes Relative: 32 %
Lymphs Abs: 2.1 10*3/uL (ref 0.7–4.0)
MCH: 29.6 pg (ref 26.0–34.0)
MCHC: 31.4 g/dL (ref 30.0–36.0)
MCV: 94.1 fL (ref 78.0–100.0)
Monocytes Absolute: 0.7 10*3/uL (ref 0.1–1.0)
Monocytes Relative: 11 %
Neutro Abs: 3.3 10*3/uL (ref 1.7–7.7)
Neutrophils Relative %: 50 %
Platelets: 297 10*3/uL (ref 150–400)
RBC: 3.89 MIL/uL (ref 3.87–5.11)
RDW: 14.5 % (ref 11.5–15.5)
WBC: 6.6 10*3/uL (ref 4.0–10.5)

## 2015-04-27 LAB — URINALYSIS, ROUTINE W REFLEX MICROSCOPIC
Bilirubin Urine: NEGATIVE
Glucose, UA: NEGATIVE mg/dL
Hgb urine dipstick: NEGATIVE
Ketones, ur: NEGATIVE mg/dL
Leukocytes, UA: NEGATIVE
Nitrite: NEGATIVE
Protein, ur: NEGATIVE mg/dL
Specific Gravity, Urine: 1.02 (ref 1.005–1.030)
pH: 7 (ref 5.0–8.0)

## 2015-04-27 NOTE — ED Notes (Signed)
Pt remains asleep, states she is fine when asked.  Waiting for PTAR.

## 2015-04-27 NOTE — ED Notes (Signed)
Transportation called to take back to [facility].  Pt moved to [facility].  Pt resting with no complaints

## 2015-04-27 NOTE — Progress Notes (Signed)
CSW met with patient at bedside. Patient was no effectively communicative. There was no family present.  Per note, patient is from [facility] and presents to [facility] due to unwitnessed fall. Also, note states that patient has a hx of dementia. CSW consulted with nurse who states that patient has not been speaking much today.  CSW provided patient with warm blanket.  Willette Brace [phone] ED CSW 04/27/2015 5:06 PM

## 2015-04-27 NOTE — ED Provider Notes (Signed)
CSN: [id number]     Arrival date & time 04/27/15  1613 History   First MD Initiated Contact with Patient 04/27/15 1634     Chief Complaint  Patient presents with  . Fall    HPI level V caveat due to dementia.  82 year old female presents status post fall. Patient has a history of dementia. No signs of trauma on exam, patient unable to provide any relevant information. Chart review shows no anticoagulants/platelets. Patient has a history of a similar fall on 01/31/2015. Patient appears to be in no acute distress, when I ask about pain she denies any.     Past Medical History  Diagnosis Date  . Hypertension   . Dyslipidemia   . History of stress test 07/07/2009    was nonischemic  . Cancer ([place])     skin  . Hx of echocardiogram 11/2003    positive for anterior ischemia  . History of stress test 07/2009    normal myocardial perfusion study.  . Hypothyroid   . Alzheimer's disease 04/22/2014  . Falls    Past Surgical History  Procedure Laterality Date  . Cardiac catheterization  12/03/2003    was found to have normal coronary arteries and normal LV funtion   Family History  Problem Relation Age of Onset  . Heart attack Father 73  . Heart attack Brother   . Heart disease Brother   . Heart failure Mother   . Cancer Sister     liver  . Dementia Neg Hx    Social History  Substance Use Topics  . Smoking status: Never Smoker   . Smokeless tobacco: Never Used  . Alcohol Use: No   OB History    No data available     Review of Systems  All other systems reviewed and are negative.   Allergies  Lipitor  Home Medications   Prior to Admission medications   Medication Sig Start Date End Date Taking? Authorizing Provider  acetaminophen (TYLENOL) 500 MG tablet Take 500 mg by mouth every 4 (four) hours as needed for moderate pain.   Yes Historical Provider, MD  ALPRAZolam (XANAX) 0.25 MG tablet Take 0.25 mg by mouth every 8 (eight) hours as needed for anxiety.   Yes  Historical Provider, MD  alum & mag hydroxide-simeth (MAALOX/MYLANTA) 200-200-20 MG/5ML suspension Take 30 mLs by mouth every 6 (six) hours as needed for indigestion or heartburn. Reported on 04/27/2015   Yes Historical Provider, MD  aspirin 81 MG chewable tablet Chew 81 mg by mouth daily.   Yes Historical Provider, MD  cholecalciferol (VITAMIN D) 1000 UNITS tablet Take 2,000 Units by mouth every morning. 01/29/15 01/29/16 Yes Historical Provider, MD  cyanocobalamin 1000 MCG tablet Take 1,000 mcg by mouth every morning.   Yes Historical Provider, MD  divalproex (DEPAKOTE SPRINKLE) 125 MG capsule Take 250 mg by mouth 3 (three) times daily with meals. 01/29/15 01/29/16 Yes Historical Provider, MD  docusate sodium (COLACE) 100 MG capsule Take 100 mg by mouth 2 (two) times daily.   Yes Historical Provider, MD  escitalopram (LEXAPRO) 5 MG tablet Take 5 mg by mouth daily.   Yes Historical Provider, MD  guaifenesin (ROBITUSSIN) 100 MG/5ML syrup Take 200 mg by mouth every 6 (six) hours as needed for cough.   Yes Historical Provider, MD  hydrOXYzine (ATARAX/VISTARIL) 25 MG tablet Take 25 mg by mouth every 4 (four) hours as needed for anxiety.   Yes Historical Provider, MD  levothyroxine (SYNTHROID, LEVOTHROID) 50 MCG tablet Take  50 mcg by mouth daily before breakfast.   Yes Historical Provider, MD  loperamide (IMODIUM A-D) 2 MG tablet Take 2 mg by mouth 4 (four) times daily as needed for diarrhea or loose stools.   Yes Historical Provider, MD  magnesium hydroxide (MILK OF MAGNESIA) 400 MG/5ML suspension Take 30 mLs by mouth at bedtime as needed for mild constipation.   Yes Historical Provider, MD  Melatonin 3 MG TABS Take 9 mg by mouth at bedtime. 01/29/15  Yes Historical Provider, MD  metoprolol tartrate (LOPRESSOR) 25 MG tablet Take 12.5 mg by mouth 2 (two) times daily.    Yes Historical Provider, MD  mirtazapine (REMERON) 7.5 MG tablet Take 7.5 mg by mouth at bedtime.   Yes Historical Provider, MD  Multiple  Vitamin (THERA) TABS Take 1 tablet by mouth every morning. 01/29/15  Yes Historical Provider, MD  neomycin-bacitracin-polymyxin (NEOSPORIN) [phone] ointment Apply 1 application topically daily as needed (skin tears and abrasions.). Apply after cleaning with normal saline, cover with band-aid or gauze and secure with tape. Changed until healed.   Yes Historical Provider, MD  Nutritional Supplements (NUTRITIONAL DRINK PO) Take 1 each by mouth 3 (three) times daily. Mighty shakes   Yes Historical Provider, MD  polyethylene glycol (MIRALAX / GLYCOLAX) packet Take 17 g by mouth daily.   Yes Historical Provider, MD  risperiDONE (RISPERDAL) 1 MG/ML oral solution Take 1 mg by mouth every morning. In orange juice or other fluids.   Yes Historical Provider, MD  rivastigmine (EXELON) 4.6 mg/24hr Place 4.6 mg onto the skin daily.   Yes Historical Provider, MD  tamsulosin (FLOMAX) 0.4 MG CAPS capsule Take 0.4 mg by mouth 2 (two) times daily. 01/29/15  Yes Historical Provider, MD  traZODone (DESYREL) 50 MG tablet Take 50 mg by mouth at bedtime as needed for sleep.   Yes Historical Provider, MD  azithromycin (ZITHROMAX) 250 MG tablet Take 1 tablet (250 mg total) by mouth daily. Take 1 every day until finished. Patient not taking: Reported on 04/27/2015 03/20/15   Lacretia Leigh, MD  donepezil (ARICEPT) 5 MG tablet Take 10 mg by mouth at bedtime. Reported on 04/27/2015    Historical Provider, MD  ezetimibe (ZETIA) 10 MG tablet Take 1 tablet (10 mg total) by mouth daily. NEED APPOINTMENT FOR FUTURE REFILLS. Patient not taking: Reported on 03/20/2015 03/12/14   Lorretta Harp, MD  hydrochlorothiazide (MICROZIDE) 12.5 MG capsule TAKE 1 CAPSULE BY MOUTH DAILY. Patient not taking: Reported on 01/31/2015 06/13/14   Lorretta Harp, MD  sertraline (ZOLOFT) 50 MG tablet TAKE 1 TABLET BY MOUTH DAILY. Patient not taking: Reported on 01/31/2015 06/12/14   Kathrynn Ducking, MD  traZODone (DESYREL) 50 MG tablet Take 50 mg by  mouth daily as needed for sleep. sleep 01/29/15 02/28/15  Historical Provider, MD   BP 115/86 mmHg  Pulse 61  Temp(Src) 98.4 F (36.9 C) (Oral)  Resp 16  SpO2 96%    Physical Exam  Constitutional: She is oriented to person, place, and time. She appears well-developed and well-nourished. No distress.  HENT:  Head: Normocephalic and atraumatic.  Eyes: Conjunctivae are normal. Pupils are equal, round, and reactive to light. Right eye exhibits no discharge. Left eye exhibits no discharge. No scleral icterus.  Neck: Normal range of motion. Neck supple. No JVD present. No tracheal deviation present.  Pulmonary/Chest: Effort normal. No stridor.  Nontender to palpation  Abdominal: Soft. She exhibits no distension. There is no tenderness. There is no rebound and no guarding.  Musculoskeletal: Normal range of motion. She exhibits no edema or tenderness.  No C, T, or L spine tenderness to palpation. No obvious signs of trauma, deformity, infection, step-offs. Lung expansion normal. No scoliosis or kyphosis. Bilateral lower extremity strength 5 out of 5, sensation grossly intact, patellar reflexes 2+, pedal pulses 2+, Refill less than 3 seconds.  Straight leg negative  Hips are stable, nontender, lower extremities nontender to palpation full active range of motion.    Neurological: She is alert and oriented to person, place, and time. Coordination normal.  Skin: Skin is warm and dry. She is not diaphoretic.  Psychiatric: She has a normal mood and affect. Her behavior is normal. Judgment and thought content normal.  Nursing note and vitals reviewed.   ED Course  Procedures (including critical care time) Labs Review Labs Reviewed  CBC WITH DIFFERENTIAL/PLATELET - Abnormal; Notable for the following:    Hemoglobin 11.5 (*)    All other components within normal limits  BASIC METABOLIC PANEL - Abnormal; Notable for the following:    BUN 23 (*)    All other components within normal limits   URINALYSIS, ROUTINE W REFLEX MICROSCOPIC (NOT AT [facility]) - Abnormal; Notable for the following:    APPearance CLOUDY (*)    All other components within normal limits    Imaging Review Ct Head Wo Contrast  04/27/2015  CLINICAL DATA:  Unwitnessed fall. Dementia. Alzheimer' s disease. Hypertension. EXAM: CT HEAD WITHOUT CONTRAST CT CERVICAL SPINE WITHOUT CONTRAST TECHNIQUE: Multidetector CT imaging of the head and cervical spine was performed following the standard protocol without intravenous contrast. Multiplanar CT image reconstructions of the cervical spine were also generated. COMPARISON:  01/31/2015 FINDINGS: CT HEAD FINDINGS Stable cerebral cavernous malformation in the right temporal lobe, image 9 series 3, no change from prior MRI 06/11/2007. Faint calcifications along the globus pallidus nuclei bilaterally. Ex vacuo ventricular prominence. Periventricular white matter and corona radiata hypodensities favor chronic ischemic microvascular white matter disease. No intracranial hemorrhage, mass lesion, or acute CVA. CT CERVICAL SPINE FINDINGS Prominent degenerative facet arthropathy at C3-4. In conjunction with uncinate spurring this causes some borderline osseous foraminal stenosis at C3-4. No vertebral subluxation is observed. No cervical spine fracture is identified. There is biapical pleural parenchymal scarring. IMPRESSION: 1. No acute intracranial findings are acute cervical spine findings. 2. Stable cerebral cavernous vascular malformation anteriorly in the right temporal lobe, not appreciably changed from 2009. 3. Periventricular white matter and corona radiata hypodensities favor chronic ischemic microvascular white matter disease. 4. Cervical spondylosis at C3-4 causing borderline osseous foraminal stenosis bilaterally at this level. Electronically Signed   By: Van Clines M.D.   On: 04/27/2015 17:45   Ct Cervical Spine Wo Contrast  04/27/2015  CLINICAL DATA:  Unwitnessed fall.  Dementia. Alzheimer' s disease. Hypertension. EXAM: CT HEAD WITHOUT CONTRAST CT CERVICAL SPINE WITHOUT CONTRAST TECHNIQUE: Multidetector CT imaging of the head and cervical spine was performed following the standard protocol without intravenous contrast. Multiplanar CT image reconstructions of the cervical spine were also generated. COMPARISON:  01/31/2015 FINDINGS: CT HEAD FINDINGS Stable cerebral cavernous malformation in the right temporal lobe, image 9 series 3, no change from prior MRI 06/11/2007. Faint calcifications along the globus pallidus nuclei bilaterally. Ex vacuo ventricular prominence. Periventricular white matter and corona radiata hypodensities favor chronic ischemic microvascular white matter disease. No intracranial hemorrhage, mass lesion, or acute CVA. CT CERVICAL SPINE FINDINGS Prominent degenerative facet arthropathy at C3-4. In conjunction with uncinate spurring this causes some borderline osseous foraminal stenosis  at C3-4. No vertebral subluxation is observed. No cervical spine fracture is identified. There is biapical pleural parenchymal scarring. IMPRESSION: 1. No acute intracranial findings are acute cervical spine findings. 2. Stable cerebral cavernous vascular malformation anteriorly in the right temporal lobe, not appreciably changed from 2009. 3. Periventricular white matter and corona radiata hypodensities favor chronic ischemic microvascular white matter disease. 4. Cervical spondylosis at C3-4 causing borderline osseous foraminal stenosis bilaterally at this level. Electronically Signed   By: Van Clines M.D.   On: 04/27/2015 17:45   I have personally reviewed and evaluated these images and lab results as part of my medical decision-making.   EKG Interpretation None      MDM   Final diagnoses:  Fall, initial encounter    Labs:  Imaging:  Consults:  Therapeutics:  Discharge Meds:   Assessment/Plan: Patient with history of dementia with fall today.  She is atraumatic, with normal CT head and cervical spine. No significant findings on labs that would necessitate further evaluation and management here in the ED. Patient will be discharged home.         Okey Regal, PA-C 04/28/15 0130  Leo Grosser, MD 04/29/15 [phone]

## 2015-04-27 NOTE — ED Notes (Signed)
WENT TO COLLECT BLOOD SAMPLES NO ONE IN ROOM

## 2015-04-27 NOTE — ED Notes (Signed)
Bed: [id number] Expected date:  Expected time:  Means of arrival:  Comments: EMS- elderly fall room 22

## 2015-04-27 NOTE — ED Notes (Signed)
UNABLE TO COLLECT LABS AT THIS TIME PATIENT IS  NOT IN THE ROOM. 

## 2015-04-27 NOTE — ED Notes (Signed)
Patient presents from [facility] for unwitnessed fall, history of dementia, denies pain.   Last VS: 140/78, 98hr, 16rsp, 98%ra

## 2015-04-27 NOTE — ED Notes (Signed)
Pt ambulated without assistance.  Pt ambulated 30 feet.  Pt vital signs are within normal limits.   Informed PA

## 2015-06-05 ENCOUNTER — Inpatient Hospital Stay (HOSPITAL_COMMUNITY)
Admission: EM | Admit: 2015-06-05 | Discharge: 2015-06-13 | DRG: 177 | Disposition: A | Discharge status: Skilled Nursing Facility | Payer: Medicare HMO | Attending: Internal Medicine | Admitting: Internal Medicine

## 2015-06-05 ENCOUNTER — Emergency Department (HOSPITAL_COMMUNITY): Payer: Medicare HMO

## 2015-06-05 ENCOUNTER — Encounter (HOSPITAL_COMMUNITY): Payer: Self-pay | Admitting: Emergency Medicine

## 2015-06-05 DIAGNOSIS — Z7189 Other specified counseling: Secondary | ICD-10-CM | POA: Diagnosis not present

## 2015-06-05 DIAGNOSIS — F0280 Dementia in other diseases classified elsewhere without behavioral disturbance: Secondary | ICD-10-CM | POA: Diagnosis present

## 2015-06-05 DIAGNOSIS — N390 Urinary tract infection, site not specified: Secondary | ICD-10-CM | POA: Diagnosis present

## 2015-06-05 DIAGNOSIS — Z7982 Long term (current) use of aspirin: Secondary | ICD-10-CM

## 2015-06-05 DIAGNOSIS — E785 Hyperlipidemia, unspecified: Secondary | ICD-10-CM | POA: Diagnosis present

## 2015-06-05 DIAGNOSIS — N3000 Acute cystitis without hematuria: Secondary | ICD-10-CM

## 2015-06-05 DIAGNOSIS — J189 Pneumonia, unspecified organism: Secondary | ICD-10-CM

## 2015-06-05 DIAGNOSIS — Z888 Allergy status to other drugs, medicaments and biological substances status: Secondary | ICD-10-CM | POA: Diagnosis not present

## 2015-06-05 DIAGNOSIS — J690 Pneumonitis due to inhalation of food and vomit: Secondary | ICD-10-CM | POA: Diagnosis present

## 2015-06-05 DIAGNOSIS — Z515 Encounter for palliative care: Secondary | ICD-10-CM | POA: Insufficient documentation

## 2015-06-05 DIAGNOSIS — G308 Other Alzheimer's disease: Secondary | ICD-10-CM | POA: Diagnosis not present

## 2015-06-05 DIAGNOSIS — Y95 Nosocomial condition: Secondary | ICD-10-CM | POA: Diagnosis present

## 2015-06-05 DIAGNOSIS — F0391 Unspecified dementia with behavioral disturbance: Secondary | ICD-10-CM

## 2015-06-05 DIAGNOSIS — J9621 Acute and chronic respiratory failure with hypoxia: Secondary | ICD-10-CM | POA: Diagnosis not present

## 2015-06-05 DIAGNOSIS — G309 Alzheimer's disease, unspecified: Secondary | ICD-10-CM | POA: Diagnosis present

## 2015-06-05 DIAGNOSIS — Z800 Family history of malignant neoplasm of digestive organs: Secondary | ICD-10-CM | POA: Diagnosis not present

## 2015-06-05 DIAGNOSIS — F329 Major depressive disorder, single episode, unspecified: Secondary | ICD-10-CM | POA: Diagnosis present

## 2015-06-05 DIAGNOSIS — F0281 Dementia in other diseases classified elsewhere with behavioral disturbance: Secondary | ICD-10-CM | POA: Diagnosis present

## 2015-06-05 DIAGNOSIS — Z8249 Family history of ischemic heart disease and other diseases of the circulatory system: Secondary | ICD-10-CM

## 2015-06-05 DIAGNOSIS — Z79899 Other long term (current) drug therapy: Secondary | ICD-10-CM | POA: Diagnosis not present

## 2015-06-05 DIAGNOSIS — R5383 Other fatigue: Secondary | ICD-10-CM

## 2015-06-05 DIAGNOSIS — R636 Underweight: Secondary | ICD-10-CM | POA: Diagnosis present

## 2015-06-05 DIAGNOSIS — R4182 Altered mental status, unspecified: Secondary | ICD-10-CM | POA: Diagnosis present

## 2015-06-05 DIAGNOSIS — E039 Hypothyroidism, unspecified: Secondary | ICD-10-CM | POA: Diagnosis present

## 2015-06-05 DIAGNOSIS — G9341 Metabolic encephalopathy: Secondary | ICD-10-CM | POA: Diagnosis present

## 2015-06-05 DIAGNOSIS — B952 Enterococcus as the cause of diseases classified elsewhere: Secondary | ICD-10-CM | POA: Diagnosis present

## 2015-06-05 DIAGNOSIS — E038 Other specified hypothyroidism: Secondary | ICD-10-CM | POA: Diagnosis not present

## 2015-06-05 DIAGNOSIS — G9340 Encephalopathy, unspecified: Secondary | ICD-10-CM

## 2015-06-05 DIAGNOSIS — I10 Essential (primary) hypertension: Secondary | ICD-10-CM | POA: Diagnosis present

## 2015-06-05 DIAGNOSIS — Z681 Body mass index (BMI) 19 or less, adult: Secondary | ICD-10-CM

## 2015-06-05 DIAGNOSIS — J9601 Acute respiratory failure with hypoxia: Secondary | ICD-10-CM | POA: Diagnosis present

## 2015-06-05 DIAGNOSIS — J181 Lobar pneumonia, unspecified organism: Secondary | ICD-10-CM | POA: Diagnosis not present

## 2015-06-05 DIAGNOSIS — R410 Disorientation, unspecified: Secondary | ICD-10-CM

## 2015-06-05 HISTORY — DX: Depression, unspecified: F32.A

## 2015-06-05 HISTORY — DX: Major depressive disorder, single episode, unspecified: F32.9

## 2015-06-05 LAB — COMPREHENSIVE METABOLIC PANEL
ALT: 16 U/L (ref 14–54)
AST: 27 U/L (ref 15–41)
Albumin: 3.6 g/dL (ref 3.5–5.0)
Alkaline Phosphatase: 76 U/L (ref 38–126)
Anion gap: 9 (ref 5–15)
BUN: 28 mg/dL — ABNORMAL HIGH (ref 6–20)
CO2: 29 mmol/L (ref 22–32)
Calcium: 9.5 mg/dL (ref 8.9–10.3)
Chloride: 103 mmol/L (ref 101–111)
Creatinine, Ser: 0.95 mg/dL (ref 0.44–1.00)
GFR calc Af Amer: >60 mL/min (ref >60)
GFR calc non Af Amer: 55 mL/min — ABNORMAL LOW (ref >60)
Glucose, Bld: 125 mg/dL — ABNORMAL HIGH (ref 65–99)
Potassium: 4.9 mmol/L (ref 3.5–5.1)
Sodium: 141 mmol/L (ref 135–145)
Total Bilirubin: 0.4 mg/dL (ref 0.3–1.2)
Total Protein: 7.1 g/dL (ref 6.5–8.1)

## 2015-06-05 LAB — CBC WITH DIFFERENTIAL/PLATELET
Basophils Absolute: 0 10*3/uL (ref 0.0–0.1)
Basophils Relative: 0 %
Eosinophils Absolute: 0.1 10*3/uL (ref 0.0–0.7)
Eosinophils Relative: 1 %
HCT: 40.3 % (ref 36.0–46.0)
Hemoglobin: 12.8 g/dL (ref 12.0–15.0)
Lymphocytes Relative: 16 %
Lymphs Abs: 1 10*3/uL (ref 0.7–4.0)
MCH: 30.3 pg (ref 26.0–34.0)
MCHC: 31.8 g/dL (ref 30.0–36.0)
MCV: 95.5 fL (ref 78.0–100.0)
Monocytes Absolute: 1.1 10*3/uL — ABNORMAL HIGH (ref 0.1–1.0)
Monocytes Relative: 17 %
Neutro Abs: 4.4 10*3/uL (ref 1.7–7.7)
Neutrophils Relative %: 66 %
Platelets: 308 10*3/uL (ref 150–400)
RBC: 4.22 MIL/uL (ref 3.87–5.11)
RDW: 15.2 % (ref 11.5–15.5)
WBC: 6.6 10*3/uL (ref 4.0–10.5)

## 2015-06-05 LAB — URINALYSIS, ROUTINE W REFLEX MICROSCOPIC
Bilirubin Urine: NEGATIVE
Glucose, UA: NEGATIVE mg/dL
Hgb urine dipstick: NEGATIVE
Ketones, ur: NEGATIVE mg/dL
Nitrite: NEGATIVE
Protein, ur: NEGATIVE mg/dL
Specific Gravity, Urine: 1.017 (ref 1.005–1.030)
pH: 7.5 (ref 5.0–8.0)

## 2015-06-05 LAB — URINE MICROSCOPIC-ADD ON
RBC / HPF: NONE SEEN RBC/hpf (ref 0–5)
Squamous Epithelial / LPF: NONE SEEN

## 2015-06-05 LAB — I-STAT CG4 LACTIC ACID, ED
LACTIC ACID, VENOUS: 1.25 mmol/L (ref 0.5–2.0)
Lactic Acid, Venous: 2.2 mmol/L (ref 0.5–2.0)

## 2015-06-05 MED ORDER — METOPROLOL TARTRATE 12.5 MG HALF TABLET
12.5000 mg | ORAL_TABLET | Freq: Two times a day (BID) | ORAL | Status: DC
  Administered 2015-06-05 – 2015-06-13 (×16): 12.5 mg via ORAL
  Filled 2015-06-05 (×17): qty 1

## 2015-06-05 MED ORDER — DIVALPROEX SODIUM 125 MG PO CSDR
250.0000 mg | DELAYED_RELEASE_CAPSULE | Freq: Three times a day (TID) | ORAL | Status: DC
  Administered 2015-06-06 – 2015-06-13 (×20): 250 mg via ORAL
  Filled 2015-06-05 (×26): qty 2

## 2015-06-05 MED ORDER — RISPERIDONE 1 MG PO TABS
1.0000 mg | ORAL_TABLET | Freq: Every morning | ORAL | Status: DC
  Administered 2015-06-06 – 2015-06-11 (×6): 1 mg via ORAL
  Filled 2015-06-05 (×6): qty 1

## 2015-06-05 MED ORDER — SODIUM CHLORIDE 0.9 % IV SOLN
INTRAVENOUS | Status: AC
  Administered 2015-06-05: 23:00:00 via INTRAVENOUS

## 2015-06-05 MED ORDER — MIRTAZAPINE 7.5 MG PO TABS
7.5000 mg | ORAL_TABLET | Freq: Every day | ORAL | Status: DC
  Administered 2015-06-05 – 2015-06-12 (×8): 7.5 mg via ORAL
  Filled 2015-06-05 (×9): qty 1

## 2015-06-05 MED ORDER — LEVOTHYROXINE SODIUM 50 MCG PO TABS
50.0000 ug | ORAL_TABLET | Freq: Every day | ORAL | Status: DC
  Administered 2015-06-06 – 2015-06-13 (×8): 50 ug via ORAL
  Filled 2015-06-05 (×11): qty 1

## 2015-06-05 MED ORDER — ESCITALOPRAM OXALATE 5 MG PO TABS
5.0000 mg | ORAL_TABLET | Freq: Every day | ORAL | Status: DC
  Administered 2015-06-06 – 2015-06-13 (×8): 5 mg via ORAL
  Filled 2015-06-05 (×8): qty 1

## 2015-06-05 MED ORDER — TAMSULOSIN HCL 0.4 MG PO CAPS
0.4000 mg | ORAL_CAPSULE | Freq: Two times a day (BID) | ORAL | Status: DC
  Administered 2015-06-05 – 2015-06-13 (×16): 0.4 mg via ORAL
  Filled 2015-06-05 (×17): qty 1

## 2015-06-05 MED ORDER — SODIUM CHLORIDE 0.9 % IV BOLUS (SEPSIS)
1000.0000 mL | Freq: Once | INTRAVENOUS | Status: AC
  Administered 2015-06-05: 1000 mL via INTRAVENOUS

## 2015-06-05 MED ORDER — LEVOFLOXACIN IN D5W 750 MG/150ML IV SOLN
750.0000 mg | INTRAVENOUS | Status: DC
  Administered 2015-06-05: 750 mg via INTRAVENOUS
  Filled 2015-06-05: qty 150

## 2015-06-05 MED ORDER — TRAZODONE HCL 50 MG PO TABS: 50.0000 mg | ORAL_TABLET | Freq: Every evening | ORAL | Status: DC | PRN

## 2015-06-05 MED ORDER — DEXTROSE 5 % IV SOLN
1.0000 g | Freq: Every day | INTRAVENOUS | Status: DC
  Administered 2015-06-05 – 2015-06-07 (×3): 1 g via INTRAVENOUS
  Filled 2015-06-05 (×4): qty 10

## 2015-06-05 MED ORDER — ENOXAPARIN SODIUM 40 MG/0.4ML ~~LOC~~ SOLN
40.0000 mg | Freq: Every day | SUBCUTANEOUS | Status: DC
  Administered 2015-06-05 – 2015-06-12 (×8): 40 mg via SUBCUTANEOUS
  Filled 2015-06-05 (×9): qty 0.4

## 2015-06-05 MED ORDER — DEXTROSE 5 % IV SOLN
500.0000 mg | Freq: Every day | INTRAVENOUS | Status: DC
  Administered 2015-06-06 – 2015-06-07 (×2): 500 mg via INTRAVENOUS
  Filled 2015-06-05 (×2): qty 500

## 2015-06-05 MED ORDER — ALPRAZOLAM 0.25 MG PO TABS: 0.2500 mg | ORAL_TABLET | Freq: Three times a day (TID) | ORAL | Status: DC | PRN

## 2015-06-05 MED ORDER — QUETIAPINE 12.5 MG HALF TABLET
12.5000 mg | ORAL_TABLET | Freq: Two times a day (BID) | ORAL | Status: DC
  Administered 2015-06-05 – 2015-06-13 (×16): 12.5 mg via ORAL
  Filled 2015-06-05 (×18): qty 1

## 2015-06-05 MED ORDER — ASPIRIN 81 MG PO CHEW
81.0000 mg | CHEWABLE_TABLET | Freq: Every day | ORAL | Status: DC
  Administered 2015-06-06 – 2015-06-13 (×8): 81 mg via ORAL
  Filled 2015-06-05 (×8): qty 1

## 2015-06-05 MED ORDER — DEXTROSE 5 % IV SOLN: 1.0000 g | Freq: Once | INTRAVENOUS | Status: DC

## 2015-06-05 MED ORDER — ACETAMINOPHEN 650 MG RE SUPP
650.0000 mg | Freq: Once | RECTAL | Status: AC
  Administered 2015-06-05: 650 mg via RECTAL
  Filled 2015-06-05: qty 1

## 2015-06-05 NOTE — ED Notes (Signed)
Pt agitated and not at baseline per son, bilateral lung sounds clear. Pt NAD

## 2015-06-05 NOTE — ED Provider Notes (Signed)
5:22 PM Patient signed out to me by Lawyer, PA-C.  Patient has demented, but has been more agitated and altered than normal.  Here with son, who states that this behavior is abnormal for her.    Lawyer, PA-C, spoke with Dr. Waldron Labs regarding patient who advised fluids, abx, and recheck.  Sign out plan is to reassess after fluids and abx.   6:26 PM Patient still not at baseline. Dr. Darl Householder, recommends that I consult hospitalist again for formal consult if still felt that patient can be discharged on their side.  Otherwise, patient will need to be admitted.  She is still more altered than normal.  Dr. Darl Householder recommends switching to levaquin which will treat urine and possible HCAP.  Notified that temp increased to 100.2.  Will check rectal temp and give rectal tylenol.  6:46 PM Appreciate Dr. Roel Cluck for admitting the patient.  Recommends recheck of lactic acid.   Montine Circle, PA-C 06/05/15 1849  Wandra Arthurs, MD 06/05/15 [phone]

## 2015-06-05 NOTE — ED Notes (Signed)
Phlebotomy at bedside; will attempt in and out catheter with completion of lab draw.

## 2015-06-05 NOTE — Progress Notes (Signed)
Pharmacy Antibiotic Note  Janet Knight is a 82 y.o. female admitted on 06/05/2015 with pneumonia and UTI.  Pharmacy has been consulted for Levaquin dosing.  Plan: Levaquin 750mg  IV q24 for estimated CrCl N of 53 ml/min/1.34m2. Will adjust dose for renal function prn     Temp (24hrs), Avg:99.5 F (37.5 C), Min:99.5 F (37.5 C), Max:99.5 F (37.5 C)   Recent Labs Lab 06/05/15 1555 06/05/15 1609  WBC 6.6  --   CREATININE 0.95  --   LATICACIDVEN  --  2.20*    CrCl cannot be calculated (Unknown ideal weight.).    Allergies  Allergen Reactions  . Lipitor [Atorvastatin]     myalgias    Antimicrobials this admission: Levaquin 3/3 >>   Dose adjustments this admission:  Microbiology results: pending  Thank you for allowing pharmacy to be a part of this patient's care.   Adrian Saran, PharmD, BCPS Pager [phone] 06/05/2015 5:22 PM

## 2015-06-05 NOTE — H&P (Signed)
PCP:  Gilford Rile, MD  Neurology Jannifer Franklin  Referring provider Rob   Chief Complaint:  confused  HPI: Janet Knight is a 82 y.o. female   has a past medical history of Hypertension; Dyslipidemia; History of stress test (07/07/2009); Cancer [facility]); echocardiogram (11/2003); History of stress test (07/2009); Hypothyroid; Alzheimer's disease (04/22/2014); and Falls.   Presented with  Altered mental status Assisted living facility called EMS due to aggressive behavior.  Patient is from dementia care assisted living at her baseline able to recognize family but alert to self only and has severe dementia. He should unable to provide herself any history. Family states that they were not aware of any fevers or chills or any complaints from the patient  IN ER: UA  small Leukocytes few bacteria lactic acid noted elevated at 2.2  In ER febrile up to 100.2 WBC 6.6  Regarding pertinent past history: dementia with behavior disturbance, history of hypertension for which she is on metoprolol  Hospitalist was called for admission for acute encephalopathy with evidence of HCAP  Review of Systems:  Unable to obtain due to confusion Past Medical History: Past Medical History  Diagnosis Date  . Hypertension   . Dyslipidemia   . History of stress test 07/07/2009    was nonischemic  . Cancer ([facility])     skin  . Hx of echocardiogram 11/2003    positive for anterior ischemia  . History of stress test 07/2009    normal myocardial perfusion study.  . Hypothyroid   . Alzheimer's disease 04/22/2014  . Falls    Past Surgical History  Procedure Laterality Date  . Cardiac catheterization  12/03/2003    was found to have normal coronary arteries and normal LV funtion     Medications: Prior to Admission medications   Medication Sig Start Date End Date Taking? Authorizing Provider  ALPRAZolam (XANAX) 0.25 MG tablet Take 0.25 mg by mouth 3 (three) times daily.    Yes Historical Provider, MD  aspirin  81 MG chewable tablet Chew 81 mg by mouth daily.   Yes Historical Provider, MD  cholecalciferol (VITAMIN D) 1000 UNITS tablet Take 2,000 Units by mouth every morning. 01/29/15 01/29/16 Yes Historical Provider, MD  cyanocobalamin 1000 MCG tablet Take 1,000 mcg by mouth every morning.   Yes Historical Provider, MD  divalproex (DEPAKOTE SPRINKLE) 125 MG capsule Take 250 mg by mouth 3 (three) times daily with meals. 01/29/15 01/29/16 Yes Historical Provider, MD  docusate sodium (COLACE) 100 MG capsule Take 100 mg by mouth 2 (two) times daily.   Yes Historical Provider, MD  escitalopram (LEXAPRO) 5 MG tablet Take 5 mg by mouth daily.   Yes Historical Provider, MD  levothyroxine (SYNTHROID, LEVOTHROID) 50 MCG tablet Take 50 mcg by mouth daily before breakfast.   Yes Historical Provider, MD  Melatonin 3 MG TABS Take 9 mg by mouth at bedtime. 01/29/15  Yes Historical Provider, MD  metoprolol tartrate (LOPRESSOR) 25 MG tablet Take 12.5 mg by mouth 2 (two) times daily.    Yes Historical Provider, MD  mirtazapine (REMERON) 7.5 MG tablet Take 7.5 mg by mouth at bedtime.   Yes Historical Provider, MD  Multiple Vitamin (THERA) TABS Take 1 tablet by mouth every morning. 01/29/15  Yes Historical Provider, MD  Nutritional Supplements (NUTRITIONAL DRINK PO) Take 1 each by mouth 3 (three) times daily. Mighty shakes   Yes Historical Provider, MD  polyethylene glycol (MIRALAX / GLYCOLAX) packet Take 17 g by mouth daily.   Yes Historical  Provider, MD  QUEtiapine (SEROQUEL) 25 MG tablet Take 12.5 mg by mouth every 12 (twelve) hours.   Yes Historical Provider, MD  risperiDONE (RISPERDAL) 1 MG/ML oral solution Take 1 mg by mouth every morning. In orange juice or other fluids.   Yes Historical Provider, MD  rivastigmine (EXELON) 4.6 mg/24hr Place 4.6 mg onto the skin daily.   Yes Historical Provider, MD  tamsulosin (FLOMAX) 0.4 MG CAPS capsule Take 0.4 mg by mouth 2 (two) times daily. 01/29/15  Yes Historical Provider, MD    acetaminophen (TYLENOL) 500 MG tablet Take 500 mg by mouth every 4 (four) hours as needed for moderate pain.    Historical Provider, MD  alum & mag hydroxide-simeth (MAALOX/MYLANTA) 200-200-20 MG/5ML suspension Take 30 mLs by mouth every 6 (six) hours as needed for indigestion or heartburn. Reported on 04/27/2015    Historical Provider, MD  azithromycin (ZITHROMAX) 250 MG tablet Take 1 tablet (250 mg total) by mouth daily. Take 1 every day until finished. Patient not taking: Reported on 04/27/2015 03/20/15   Lacretia Leigh, MD  ezetimibe (ZETIA) 10 MG tablet Take 1 tablet (10 mg total) by mouth daily. NEED APPOINTMENT FOR FUTURE REFILLS. Patient not taking: Reported on 03/20/2015 03/12/14   Lorretta Harp, MD  guaifenesin (ROBITUSSIN) 100 MG/5ML syrup Take 200 mg by mouth every 6 (six) hours as needed for cough.    Historical Provider, MD  hydrochlorothiazide (MICROZIDE) 12.5 MG capsule TAKE 1 CAPSULE BY MOUTH DAILY. Patient not taking: Reported on 01/31/2015 06/13/14   Lorretta Harp, MD  hydrOXYzine (ATARAX/VISTARIL) 25 MG tablet Take 25 mg by mouth every 4 (four) hours as needed for anxiety.    Historical Provider, MD  loperamide (IMODIUM A-D) 2 MG tablet Take 2 mg by mouth 4 (four) times daily as needed for diarrhea or loose stools.    Historical Provider, MD  magnesium hydroxide (MILK OF MAGNESIA) 400 MG/5ML suspension Take 30 mLs by mouth at bedtime as needed for mild constipation.    Historical Provider, MD  neomycin-bacitracin-polymyxin (NEOSPORIN) [phone] ointment Apply 1 application topically daily as needed (skin tears and abrasions.). Apply after cleaning with normal saline, cover with band-aid or gauze and secure with tape. Changed until healed.    Historical Provider, MD  sertraline (ZOLOFT) 50 MG tablet TAKE 1 TABLET BY MOUTH DAILY. Patient not taking: Reported on 01/31/2015 06/12/14   Kathrynn Ducking, MD  traZODone (DESYREL) 50 MG tablet Take 50 mg by mouth daily as needed for  sleep. sleep 01/29/15 02/28/15  Historical Provider, MD  traZODone (DESYREL) 50 MG tablet Take 50 mg by mouth at bedtime as needed for sleep.    Historical Provider, MD    Allergies:   Allergies  Allergen Reactions  . Lipitor [Atorvastatin]     myalgias    Social History:  Ambulatory   walker  d   From facility  [facility]   reports that she has never smoked. She has never used smokeless tobacco. She reports that she does not drink alcohol or use illicit drugs.     Family History: family history includes Cancer in her sister; Heart attack in her brother; Heart attack (age of onset: 70) in her father; Heart disease in her brother; Heart failure in her mother. There is no history of Dementia.    Physical Exam: Patient Vitals for the past 24 hrs:  BP Temp Temp src Pulse Resp SpO2  06/05/15 1807 167/96 mmHg 100.2 F (37.9 C) Oral 83 14 93 %  06/05/15 1558 162/88 mmHg - - 92 18 97 %  06/05/15 1429 - 99.5 F (37.5 C) Rectal - - -  06/05/15 1406 141/77 mmHg - Oral 88 18 95 %  06/05/15 1400 - - - - - 96 %    1. General: Screaming out  patient being somewhat aggressive toward staff 2. Psychological: Alert and   Oriented 3. Head/ENT:   Dry Mucous Membranes                          Head Non traumatic, neck supple                        Poor Dentition 4. SKIN:  decreased Skin turgor,  Skin clean Dry and intact no rash 5. Heart: Regular rate and rhythm no Murmur, Rub or gallop 6. Lungs:  no wheezes or crackles   7. Abdomen: Soft, non-tender, Non distended 8. Lower extremities: no clubbing, cyanosis, or edema 9. Neurologically Grossly intact, moving all 4 extremities equally 10. MSK: Normal range of motion  body mass index is unknown because there is no weight on file.   Labs on Admission:   Results for orders placed or performed during the hospital encounter of 06/05/15 (from the past 24 hour(s))  Comprehensive metabolic panel     Status: Abnormal   Collection Time:  06/05/15  3:55 PM  Result Value Ref Range   Sodium 141 135 - 145 mmol/L   Potassium 4.9 3.5 - 5.1 mmol/L   Chloride 103 101 - 111 mmol/L   CO2 29 22 - 32 mmol/L   Glucose, Bld 125 (H) 65 - 99 mg/dL   BUN 28 (H) 6 - 20 mg/dL   Creatinine, Ser 0.95 0.44 - 1.00 mg/dL   Calcium 9.5 8.9 - 10.3 mg/dL   Total Protein 7.1 6.5 - 8.1 g/dL   Albumin 3.6 3.5 - 5.0 g/dL   AST 27 15 - 41 U/L   ALT 16 14 - 54 U/L   Alkaline Phosphatase 76 38 - 126 U/L   Total Bilirubin 0.4 0.3 - 1.2 mg/dL   GFR calc non Af Amer 55 (L) >60 mL/min   GFR calc Af Amer >60 >60 mL/min   Anion gap 9 5 - 15  CBC with Differential     Status: Abnormal   Collection Time: 06/05/15  3:55 PM  Result Value Ref Range   WBC 6.6 4.0 - 10.5 K/uL   RBC 4.22 3.87 - 5.11 MIL/uL   Hemoglobin 12.8 12.0 - 15.0 g/dL   HCT 40.3 36.0 - 46.0 %   MCV 95.5 78.0 - 100.0 fL   MCH 30.3 26.0 - 34.0 pg   MCHC 31.8 30.0 - 36.0 g/dL   RDW 15.2 11.5 - 15.5 %   Platelets 308 150 - 400 K/uL   Neutrophils Relative % 66 %   Neutro Abs 4.4 1.7 - 7.7 K/uL   Lymphocytes Relative 16 %   Lymphs Abs 1.0 0.7 - 4.0 K/uL   Monocytes Relative 17 %   Monocytes Absolute 1.1 (H) 0.1 - 1.0 K/uL   Eosinophils Relative 1 %   Eosinophils Absolute 0.1 0.0 - 0.7 K/uL   Basophils Relative 0 %   Basophils Absolute 0.0 0.0 - 0.1 K/uL  Urinalysis, Routine w reflex microscopic     Status: Abnormal   Collection Time: 06/05/15  3:58 PM  Result Value Ref Range   Color, Urine YELLOW YELLOW  APPearance CLOUDY (A) CLEAR   Specific Gravity, Urine 1.017 1.005 - 1.030   pH 7.5 5.0 - 8.0   Glucose, UA NEGATIVE NEGATIVE mg/dL   Hgb urine dipstick NEGATIVE NEGATIVE   Bilirubin Urine NEGATIVE NEGATIVE   Ketones, ur NEGATIVE NEGATIVE mg/dL   Protein, ur NEGATIVE NEGATIVE mg/dL   Nitrite NEGATIVE NEGATIVE   Leukocytes, UA SMALL (A) NEGATIVE  Urine microscopic-add on     Status: Abnormal   Collection Time: 06/05/15  3:58 PM  Result Value Ref Range   Squamous Epithelial  / LPF NONE SEEN NONE SEEN   WBC, UA 6-30 0 - 5 WBC/hpf   RBC / HPF NONE SEEN 0 - 5 RBC/hpf   Bacteria, UA FEW (A) NONE SEEN  I-Stat CG4 Lactic Acid, ED     Status: Abnormal   Collection Time: 06/05/15  4:09 PM  Result Value Ref Range   Lactic Acid, Venous 2.20 (HH) 0.5 - 2.0 mmol/L   Comment NOTIFIED PHYSICIAN     UA likely contaminated sample  No results found for: HGBA1C  CrCl cannot be calculated (Unknown ideal weight.).  BNP (last 3 results) No results for input(s): PROBNP in the last 8760 hours.  Other results:  I have pearsonaly reviewed this: ECG REPORT  Not obtained  There were no vitals filed for this visit.   Cultures:    Component Value Date/Time   SDES BLOOD RIGHT ARM 03/20/2015 1208   SPECREQUEST BOTTLES DRAWN AEROBIC AND ANAEROBIC 5CC 03/20/2015 1208   CULT  03/20/2015 1208    NO GROWTH 5 DAYS Performed at [facility] 03/25/2015 FINAL 03/20/2015 1208     Radiological Exams on Admission: Dg Chest 2 View  06/05/2015  CLINICAL DATA:  Altered mental status EXAM: CHEST  2 VIEW COMPARISON:  03/20/2015 FINDINGS: Cardiomegaly is noted. Mild elevation of the right hemidiaphragm. Persistent left base retrocardiac atelectasis or infiltrate. No pulmonary edema. There is thoracic spine osteopenia. IMPRESSION: Persistent left base retrocardiac atelectasis or infiltrate. No pulmonary edema. Borderline cardiomegaly. Electronically Signed   By: Lahoma Crocker M.D.   On: 06/05/2015 15:43    Chart has been reviewed  Family  at  Bedside  plan of care was discussed with son Arianna Reynen [id number]  Assessment/Plan  82 year old female history of hypertension and dementia secondary to Alzheimer's presents with worsening mental status father workup showed infiltrate on chest x-ray and leukocytosis in the urine will admit for dehydration and possible community-acquired pneumonia CONTRIBUTING to acute encephalopathy in the setting of dementia   Present  on Admission:  . Essential hypertension stable continue home medications  . CAP (community acquired pneumonia) -  - will admit for treatment of CAP will start on appropriate antibiotic coverage.   Obtain sputum cultures, blood cultures if febrile or if decompensates.  Provide oxygen as needed.  . Acute encephalopathy - most likely multifactorial will rehydrate treat underlying infection and continue to follow patient has dementia at baseline but currently has become more aggressive.   DEMENTIA WITH BEHAVIORAL DISTURBANCE WE'LL CONTINUE HOME MEDICATIONS  Prophylaxis:   Lovenox   CODE STATUS:  FULL CODE   as per  family  Disposition:                           Back to current facility when stable  Other plan as per orders.  I have spent a total of 109min on this admission    Tulani Kidney 06/05/2015, 7:41 PM    [facility]  Pager [phone]   after 2 AM please page floor coverage PA If 7AM-7PM, please contact the day team taking care of the patient  [email or web address]  Password [email or web address]

## 2015-06-05 NOTE — ED Notes (Signed)
Bed: WA10 Expected date:  Expected time:  Means of arrival:  Comments: EMS 

## 2015-06-05 NOTE — ED Notes (Signed)
elevated lab MD Darl Householder made aware

## 2015-06-05 NOTE — ED Provider Notes (Signed)
CSN: [id number]     Arrival date & time 06/05/15  1332 History   First MD Initiated Contact with Patient 06/05/15 1402     Chief Complaint  Patient presents with  . Altered Mental Status     (Consider location/radiation/quality/duration/timing/severity/associated sxs/prior Treatment) HPI Patient presents to the emergency department with altered mental status and agitation.  The patient does have dementia but normally is very pleasant and then today was noted to be agitated and swinging at the staff. The son states that this is abnormal behavior for her. the patient is unable to give any history.  The history is obtained from the son.  Patient has not had any vomiting or diarrhea.  Staff noted she did have a fever Past Medical History  Diagnosis Date  . Hypertension   . Dyslipidemia   . History of stress test 07/07/2009    was nonischemic  . Cancer ([place])     skin  . Hx of echocardiogram 11/2003    positive for anterior ischemia  . History of stress test 07/2009    normal myocardial perfusion study.  . Hypothyroid   . Alzheimer's disease 04/22/2014  . Falls    Past Surgical History  Procedure Laterality Date  . Cardiac catheterization  12/03/2003    was found to have normal coronary arteries and normal LV funtion   Family History  Problem Relation Age of Onset  . Heart attack Father 43  . Heart attack Brother   . Heart disease Brother   . Heart failure Mother   . Cancer Sister     liver  . Dementia Neg Hx    Social History  Substance Use Topics  . Smoking status: Never Smoker   . Smokeless tobacco: Never Used  . Alcohol Use: No   OB History    No data available     Review of Systems  Level V caveat applies due to dementia and altered mental status  Allergies  Lipitor  Home Medications   Prior to Admission medications   Medication Sig Start Date End Date Taking? Authorizing Provider  ALPRAZolam (XANAX) 0.25 MG tablet Take 0.25 mg by mouth 3 (three) times  daily.    Yes Historical Provider, MD  aspirin 81 MG chewable tablet Chew 81 mg by mouth daily.   Yes Historical Provider, MD  cholecalciferol (VITAMIN D) 1000 UNITS tablet Take 2,000 Units by mouth every morning. 01/29/15 01/29/16 Yes Historical Provider, MD  cyanocobalamin 1000 MCG tablet Take 1,000 mcg by mouth every morning.   Yes Historical Provider, MD  divalproex (DEPAKOTE SPRINKLE) 125 MG capsule Take 250 mg by mouth 3 (three) times daily with meals. 01/29/15 01/29/16 Yes Historical Provider, MD  docusate sodium (COLACE) 100 MG capsule Take 100 mg by mouth 2 (two) times daily.   Yes Historical Provider, MD  escitalopram (LEXAPRO) 5 MG tablet Take 5 mg by mouth daily.   Yes Historical Provider, MD  levothyroxine (SYNTHROID, LEVOTHROID) 50 MCG tablet Take 50 mcg by mouth daily before breakfast.   Yes Historical Provider, MD  Melatonin 3 MG TABS Take 9 mg by mouth at bedtime. 01/29/15  Yes Historical Provider, MD  metoprolol tartrate (LOPRESSOR) 25 MG tablet Take 12.5 mg by mouth 2 (two) times daily.    Yes Historical Provider, MD  mirtazapine (REMERON) 7.5 MG tablet Take 7.5 mg by mouth at bedtime.   Yes Historical Provider, MD  Multiple Vitamin (THERA) TABS Take 1 tablet by mouth every morning. 01/29/15  Yes Historical Provider,  MD  Nutritional Supplements (NUTRITIONAL DRINK PO) Take 1 each by mouth 3 (three) times daily. Mighty shakes   Yes Historical Provider, MD  polyethylene glycol (MIRALAX / GLYCOLAX) packet Take 17 g by mouth daily.   Yes Historical Provider, MD  QUEtiapine (SEROQUEL) 25 MG tablet Take 12.5 mg by mouth every 12 (twelve) hours.   Yes Historical Provider, MD  risperiDONE (RISPERDAL) 1 MG/ML oral solution Take 1 mg by mouth every morning. In orange juice or other fluids.   Yes Historical Provider, MD  rivastigmine (EXELON) 4.6 mg/24hr Place 4.6 mg onto the skin daily.   Yes Historical Provider, MD  tamsulosin (FLOMAX) 0.4 MG CAPS capsule Take 0.4 mg by mouth 2 (two) times  daily. 01/29/15  Yes Historical Provider, MD  acetaminophen (TYLENOL) 500 MG tablet Take 500 mg by mouth every 4 (four) hours as needed for moderate pain.    Historical Provider, MD  alum & mag hydroxide-simeth (MAALOX/MYLANTA) 200-200-20 MG/5ML suspension Take 30 mLs by mouth every 6 (six) hours as needed for indigestion or heartburn. Reported on 04/27/2015    Historical Provider, MD  azithromycin (ZITHROMAX) 250 MG tablet Take 1 tablet (250 mg total) by mouth daily. Take 1 every day until finished. Patient not taking: Reported on 04/27/2015 03/20/15   Lacretia Leigh, MD  ezetimibe (ZETIA) 10 MG tablet Take 1 tablet (10 mg total) by mouth daily. NEED APPOINTMENT FOR FUTURE REFILLS. Patient not taking: Reported on 03/20/2015 03/12/14   Lorretta Harp, MD  guaifenesin (ROBITUSSIN) 100 MG/5ML syrup Take 200 mg by mouth every 6 (six) hours as needed for cough.    Historical Provider, MD  hydrochlorothiazide (MICROZIDE) 12.5 MG capsule TAKE 1 CAPSULE BY MOUTH DAILY. Patient not taking: Reported on 01/31/2015 06/13/14   Lorretta Harp, MD  hydrOXYzine (ATARAX/VISTARIL) 25 MG tablet Take 25 mg by mouth every 4 (four) hours as needed for anxiety.    Historical Provider, MD  loperamide (IMODIUM A-D) 2 MG tablet Take 2 mg by mouth 4 (four) times daily as needed for diarrhea or loose stools.    Historical Provider, MD  magnesium hydroxide (MILK OF MAGNESIA) 400 MG/5ML suspension Take 30 mLs by mouth at bedtime as needed for mild constipation.    Historical Provider, MD  neomycin-bacitracin-polymyxin (NEOSPORIN) [phone] ointment Apply 1 application topically daily as needed (skin tears and abrasions.). Apply after cleaning with normal saline, cover with band-aid or gauze and secure with tape. Changed until healed.    Historical Provider, MD  sertraline (ZOLOFT) 50 MG tablet TAKE 1 TABLET BY MOUTH DAILY. Patient not taking: Reported on 01/31/2015 06/12/14   Kathrynn Ducking, MD  traZODone (DESYREL) 50 MG tablet  Take 50 mg by mouth daily as needed for sleep. sleep 01/29/15 02/28/15  Historical Provider, MD  traZODone (DESYREL) 50 MG tablet Take 50 mg by mouth at bedtime as needed for sleep.    Historical Provider, MD   BP 162/88 mmHg  Pulse 92  Temp(Src) 99.5 F (37.5 C) (Rectal)  Resp 18  SpO2 97% Physical Exam  Constitutional: She appears well-developed and well-nourished. No distress.  HENT:  Head: Normocephalic and atraumatic.  Mouth/Throat: Oropharynx is clear and moist.  Eyes: Pupils are equal, round, and reactive to light.  Neck: Normal range of motion. Neck supple.  Cardiovascular: Normal rate, regular rhythm and normal heart sounds.  Exam reveals no gallop and no friction rub.   No murmur heard. Pulmonary/Chest: Effort normal and breath sounds normal. No respiratory distress. She has no  wheezes.  Musculoskeletal: Normal range of motion. She exhibits no edema.  Neurological: She is alert.  Skin: Skin is warm and dry. No rash noted. No erythema.  Nursing note and vitals reviewed.   ED Course  Procedures (including critical care time) Labs Review Labs Reviewed  URINALYSIS, ROUTINE W REFLEX MICROSCOPIC (NOT AT [facility]) - Abnormal; Notable for the following:    APPearance CLOUDY (*)    Leukocytes, UA SMALL (*)    All other components within normal limits  COMPREHENSIVE METABOLIC PANEL - Abnormal; Notable for the following:    Glucose, Bld 125 (*)    BUN 28 (*)    GFR calc non Af Amer 55 (*)    All other components within normal limits  CBC WITH DIFFERENTIAL/PLATELET - Abnormal; Notable for the following:    Monocytes Absolute 1.1 (*)    All other components within normal limits  URINE MICROSCOPIC-ADD ON - Abnormal; Notable for the following:    Bacteria, UA FEW (*)    All other components within normal limits  I-STAT CG4 LACTIC ACID, ED - Abnormal; Notable for the following:    Lactic Acid, Venous 2.20 (*)    All other components within normal limits  URINE CULTURE     Imaging Review Dg Chest 2 View  06/05/2015  CLINICAL DATA:  Altered mental status EXAM: CHEST  2 VIEW COMPARISON:  03/20/2015 FINDINGS: Cardiomegaly is noted. Mild elevation of the right hemidiaphragm. Persistent left base retrocardiac atelectasis or infiltrate. No pulmonary edema. There is thoracic spine osteopenia. IMPRESSION: Persistent left base retrocardiac atelectasis or infiltrate. No pulmonary edema. Borderline cardiomegaly. Electronically Signed   By: Lahoma Crocker M.D.   On: 06/05/2015 15:43   I have personally reviewed and evaluated these images and lab results as part of my medical decision-making.  Patient does appear agitated and altered, and the son reports this is abnormal for her.  Patient most likely needs admission for the elevated lactate and the urinary tract infection with the altered mental status   Dalia Heading, PA-C 06/05/15 [place], MD 06/07/15 [phone]

## 2015-06-05 NOTE — Clinical Social Work Note (Signed)
Clinical Social Work Assessment  Patient Details  Name: Janet Knight MRN: [id number] Date of Birth: 1934/03/01  Date of referral:  06/05/15               Reason for consult:   (Patient is from facility. [facility].)                Permission sought to share information with:   (None.) Permission granted to share information::  No  Name::        Agency::     Relationship::     Contact Information:     Housing/Transportation Living arrangements for the past 2 months:  [place] (Son states patient has been a resident at [facility] since November.) Source of Information:  Adult Children (Son/Janet Knight [phone]) Patient Interpreter Needed:  None Criminal Activity/Legal Involvement Pertinent to Current Situation/Hospitalization:  No - Comment as needed Significant Relationships:  Adult Children, Spouse Lives with:  Facility Resident Do you feel safe going back to the place where you live?  Yes Need for family participation in patient care:  Yes (Comment) (Son and husband are supportive of patient.)  Care giving concerns:  There are no care giving concerns at this time. Patient is from [facility]. Son states patient receives assistance with ADL's from staff. Also, son states patient walks with no equipment. According to son, the patient has fallen x2 within the past 6 months.   Social Worker assessment / plan:  CSW attempted to meet with patient at bedside. However, the patient is agitated and screaming. Husband and son were present. Husband states that the patient is in a "bad mood". Son confirms that patient presents to [facility] due to agitation. Son states patient is disoriented and more aggressive than she normally is. Son states that the patient's current state is not her baseline.   Son states upon discharge he would feel safe for patient to return back to facility. Son informed CSW that patient has been living at [facility] since  November.  Husband states that he lives in [place], [place] and son lives in [place].  Son/Janet Knight [phone]  Husband/Janet Knight [phone]   Employment status:  Retired   Forensic scientist:   Actor.) PT Recommendations:  Not assessed at this time Information / Referral to community resources:   (Patient is from facility.)  Patient/Family's Response to care:  Family is aware of admission, and are appropriate at this time.  Patient/Family's Understanding of and Emotional Response to Diagnosis, Current Treatment, and Prognosis:  Family has no questions for CSW at this time.  Emotional Assessment Appearance:  Appears stated age Attitude/Demeanor/Rapport:  Screaming (screaming.) Affect (typically observed):  Agitated Orientation:   (Unable to assess. Patient is currently agitated and screaming.) Alcohol / Substance use:  Not Applicable Psych involvement (Current and /or in the community):  No (Comment)  Discharge Needs  Concerns to be addressed:  Adjustment to Illness Readmission within the last 30 days:    Current discharge risk:  None Barriers to Discharge:  No Barriers Identified   [name], LCSW 06/05/2015, 7:36 PM

## 2015-06-05 NOTE — ED Notes (Signed)
Pt from [facility] via EMS. Per staff, pt became more agitated today, swinging at staff. Pt baseline is A&O 1, which pt is at this time. Pt is ambulatory and has hx of dementia. Pt in NAD. Pt EKG en route NS with occasional PVC's per EMS

## 2015-06-06 ENCOUNTER — Encounter (HOSPITAL_COMMUNITY): Payer: Self-pay

## 2015-06-06 DIAGNOSIS — J181 Lobar pneumonia, unspecified organism: Secondary | ICD-10-CM

## 2015-06-06 DIAGNOSIS — J9601 Acute respiratory failure with hypoxia: Secondary | ICD-10-CM

## 2015-06-06 LAB — COMPREHENSIVE METABOLIC PANEL
ALBUMIN: 3.3 g/dL — AB (ref 3.5–5.0)
ALT: 15 U/L (ref 14–54)
AST: 26 U/L (ref 15–41)
Alkaline Phosphatase: 68 U/L (ref 38–126)
Anion gap: 9 (ref 5–15)
BUN: 13 mg/dL (ref 6–20)
CHLORIDE: 102 mmol/L (ref 101–111)
CO2: 24 mmol/L (ref 22–32)
Calcium: 8.4 mg/dL — ABNORMAL LOW (ref 8.9–10.3)
Creatinine, Ser: 0.77 mg/dL (ref 0.44–1.00)
GFR calc Af Amer: >60 mL/min (ref >60)
Glucose, Bld: 101 mg/dL — ABNORMAL HIGH (ref 65–99)
POTASSIUM: 4.2 mmol/L (ref 3.5–5.1)
Sodium: 135 mmol/L (ref 135–145)
Total Bilirubin: 0.2 mg/dL — ABNORMAL LOW (ref 0.3–1.2)
Total Protein: 6.6 g/dL (ref 6.5–8.1)

## 2015-06-06 LAB — CBC WITH DIFFERENTIAL/PLATELET
Basophils Absolute: 0 10*3/uL (ref 0.0–0.1)
Basophils Relative: 0 %
EOS PCT: 0 %
Eosinophils Absolute: 0 10*3/uL (ref 0.0–0.7)
HEMATOCRIT: 40.4 % (ref 36.0–46.0)
HEMOGLOBIN: 13 g/dL (ref 12.0–15.0)
LYMPHS ABS: 1.2 10*3/uL (ref 0.7–4.0)
LYMPHS PCT: 23 %
MCH: 30.7 pg (ref 26.0–34.0)
MCHC: 32.2 g/dL (ref 30.0–36.0)
MCV: 95.3 fL (ref 78.0–100.0)
Monocytes Absolute: 1 10*3/uL (ref 0.1–1.0)
Monocytes Relative: 20 %
NEUTROS ABS: 2.8 10*3/uL (ref 1.7–7.7)
Neutrophils Relative %: 57 %
PLATELETS: 301 10*3/uL (ref 150–400)
RBC: 4.24 MIL/uL (ref 3.87–5.11)
RDW: 15.1 % (ref 11.5–15.5)
WBC: 5 10*3/uL (ref 4.0–10.5)

## 2015-06-06 LAB — MRSA PCR SCREENING: MRSA by PCR: NEGATIVE

## 2015-06-06 LAB — INFLUENZA PANEL BY PCR (TYPE A & B)
H1N1FLUPCR: NOT DETECTED
INFLAPCR: NEGATIVE
INFLBPCR: NEGATIVE

## 2015-06-06 MED ORDER — LORAZEPAM 2 MG/ML IJ SOLN
0.5000 mg | Freq: Once | INTRAMUSCULAR | Status: AC
  Administered 2015-06-06: 0.5 mg via INTRAVENOUS
  Filled 2015-06-06: qty 1

## 2015-06-06 NOTE — Progress Notes (Signed)
Patient ID: Janet Knight, female   DOB: Apr 06, 1933, 82 y.o.   MRN: [id number] [facility] PROGRESS NOTE  RODOLFO YOAKAM [id number] DOB: [birth year] DOA: 06/05/2015 PCP: Gilford Rile, MD  Brief narrative:    82 y.o. female with past medical history of dementia, hypertension, hypothyroidism who presented to [facility] ED with aggressive behavior and more altered mental status. She was found to have pneumonia on admission and was started on azithromycin and rocephin.   Assessment/Plan:    Principal Problem: Acute respiratory failure with hypoxia / Lobar pneumonia, left basilar pneumonia - Oxygen saturation 93% with [place] oxygen support - Hypoxia likely due to pneumonia. CXR significant for left lower lobe pneumonia.  - Pt started on azithromycin and rocephin - Blood cultures so far show no growth - Influenza is negative - Stable respiratory status  Active Problems: Essential hypertension - Continue metoprolol - BP 119/71  Alzheimer's dementia / Acute encephalopathy / Depression - Continue lexapro, Depakote, Seroquel and risperidone   Hypothyroidism - Continue synthroid   DVT Prophylaxis  - Lovenox subQ ordered   Code Status: Full.  Family Communication:  Family not at the bedside this am Disposition Plan: Needs PT evaluation, anticipate D/C by 06/09/2015   IV access:  Peripheral IV  Procedures and diagnostic studies:    Dg Chest 2 View 06/05/2015 Persistent left base retrocardiac atelectasis or infiltrate. No pulmonary edema. Borderline cardiomegaly.   Medical Consultants:  None   Other Consultants:  SLP PT  IAnti-Infectives:   Azithromycin and rocephin 06/05/2015 -->    Leisa Lenz, MD  [facility] Pager [phone]  Time spent in minutes: 25 minutes  If 7PM-7AM, please contact night-coverage [email or web address] Password [facility] 06/06/2015, 2:39 PM   LOS: 1 day    HPI/Subjective: No acute overnight events. No respiratory distress.   Objective: Filed  Vitals:   06/05/15 2132 06/05/15 2256 06/06/15 0402 06/06/15 1000  BP: 154/86 174/96 143/77 119/71  Pulse: 88 83 74 88  Temp:  98.1 F (36.7 C) 98.2 F (36.8 C) 97.5 F (36.4 C)  TempSrc:  Axillary Axillary Axillary  Resp: 18 17 17 16   Height:      Weight:      SpO2: 96% 99% 97% 93%   No intake or output data in the 24 hours ending 06/06/15 1439  Exam:   General:  Pt is not in acute distress  Cardiovascular: Regular rate and rhythm, S1/S2 (+)  Respiratory: Diminished, wheezing in upper lung lobes   Abdomen: Soft, non tender, non distended, bowel sounds present  Extremities: No edema, pulses palpable bilaterally  Neuro: Grossly nonfocal  Data Reviewed: Basic Metabolic Panel:  Recent Labs Lab 06/05/15 1555 06/06/15 0620  NA 141 135  K 4.9 4.2  CL 103 102  CO2 29 24  GLUCOSE 125* 101*  BUN 28* 13  CREATININE 0.95 0.77  CALCIUM 9.5 8.4*   Liver Function Tests:  Recent Labs Lab 06/05/15 1555 06/06/15 0620  AST 27 26  ALT 16 15  ALKPHOS 76 68  BILITOT 0.4 0.2*  PROT 7.1 6.6  ALBUMIN 3.6 3.3*   No results for input(s): LIPASE, AMYLASE in the last 168 hours. No results for input(s): AMMONIA in the last 168 hours. CBC:  Recent Labs Lab 06/05/15 1555 06/06/15 0620  WBC 6.6 5.0  NEUTROABS 4.4 2.8  HGB 12.8 13.0  HCT 40.3 40.4  MCV 95.5 95.3  PLT 308 301   Cardiac Enzymes: No results for input(s): CKTOTAL, CKMB, CKMBINDEX, TROPONINI in the last 168 hours.  BNP: Invalid input(s): POCBNP CBG: No results for input(s): GLUCAP in the last 168 hours.  Urine culture     Status: None (Preliminary result)   Collection Time: 06/05/15  3:58 PM  Result Value Ref Range Status   Specimen Description URINE, CATHETERIZED  Final   Special Requests NONE  Final   Culture   Final    TOO YOUNG TO READ Performed at [facility]    Report Status PENDING  Incomplete  Culture, blood (Routine X 2) w Reflex to ID Panel     Status: None (Preliminary result)    Collection Time: 06/05/15  8:33 PM  Result Value Ref Range Status   Specimen Description BLOOD BLOOD RIGHT HAND  Final   Special Requests IN PEDIATRIC BOTTLE [place]  Final   Culture   Final    NO GROWTH < 12 HOURS Performed at [facility]    Report Status PENDING  Incomplete  Culture, blood (Routine X 2) w Reflex to ID Panel     Status: None (Preliminary result)   Collection Time: 06/05/15  8:36 PM  Result Value Ref Range Status   Specimen Description BLOOD LEFT ANTECUBITAL  Final   Special Requests BOTTLES DRAWN AEROBIC AND ANAEROBIC 5CC  Final   Culture   Final    NO GROWTH < 12 HOURS Performed at [facility]    Report Status PENDING  Incomplete  MRSA PCR Screening     Status: None   Collection Time: 06/06/15  1:17 AM  Result Value Ref Range Status   MRSA by PCR NEGATIVE NEGATIVE Final     Scheduled Meds: . aspirin  81 mg Oral Daily  . azithromycin  500 mg Intravenous QHS  . cefTRIAXone (ROCEPHIN)  IV  1 g Intravenous QHS  . divalproex  250 mg Oral TID WC  . enoxaparin (LOVENOX) injection  40 mg Subcutaneous QHS  . escitalopram  5 mg Oral Daily  . levothyroxine  50 mcg Oral QAC breakfast  . metoprolol tartrate  12.5 mg Oral BID  . mirtazapine  7.5 mg Oral QHS  . QUEtiapine  12.5 mg Oral Q12H  . risperiDONE  1 mg Oral q morning - 10a  . tamsulosin  0.4 mg Oral BID   Continuous Infusions:

## 2015-06-06 NOTE — Evaluation (Signed)
Clinical/Bedside Swallow Evaluation Patient Details  Name: Janet Knight MRN: [id number] Date of Birth: [birth year]  Today's Date: 06/06/2015 Time: SLP Start Time (ACUTE ONLY): 57 SLP Stop Time (ACUTE ONLY): 1433 SLP Time Calculation (min) (ACUTE ONLY): 9 min  Past Medical History:  Past Medical History  Diagnosis Date  . Hypertension   . Dyslipidemia   . History of stress test 07/07/2009    was nonischemic  . Cancer ([place])     skin  . Hx of echocardiogram 11/2003    positive for anterior ischemia  . History of stress test 07/2009    normal myocardial perfusion study.  . Hypothyroid   . Alzheimer's disease 04/22/2014  . Falls    Past Surgical History:  Past Surgical History  Procedure Laterality Date  . Cardiac catheterization  12/03/2003    was found to have normal coronary arteries and normal LV funtion   HPI:  82 year old female with PMH of Alzheimer's dementia admitted with AMS, aggressive behavior. Diagnosed with acute encephalopathy wtih evidence of HCAP. CXR with Persistent left base retrocardiac atelectasis or infiltrate.    Assessment / Plan / Recommendation Clinical Impression  Swallow evaluation complete. Patient presents with indication of decreased airway protection at baseline (wet vocal quality suggestive of aspiration of secretions) as well as through out po intake. Eyes remained closed throughout session despite max cueing however patient able to consistently open oral cavity at presentation of bolus and has what appears to be timely oral transit and swallow initiation. Multiple swallows present per bolus however suggestive of pharyngeal residuals, possible laryngeal/pharyngeal weakness. Suspect that AMS significantly contributing to inability to protect airway at this time and that swallowing function will improve with improved mentation.     Aspiration Risk  Severe aspiration risk    Diet Recommendation NPO   Medication Administration: Via alternative  means    Other  Recommendations Oral Care Recommendations: Oral care QID   Follow up Recommendations   (TBD)    Frequency and Duration min 2x/week  2 weeks       Prognosis Prognosis for Safe Diet Advancement: Good Barriers to Reach Goals: Cognitive deficits      Swallow Study   General HPI: 82 year old female with PMH of Alzheimer's dementia admitted with AMS, aggressive behavior. Diagnosed with acute encephalopathy wtih evidence of HCAP. CXR with Persistent left base retrocardiac atelectasis or infiltrate.  Type of Study: Bedside Swallow Evaluation Previous Swallow Assessment: none noted Diet Prior to this Study: NPO Temperature Spikes Noted: No Respiratory Status: Room air History of Recent Intubation: No Behavior/Cognition: Lethargic/Drowsy;Confused;Requires cueing;Doesn't follow directions Oral Cavity Assessment: Within Functional Limits Oral Cavity - Dentition: Adequate natural dentition Vision:  (TBD, eyes closed through out eval) Self-Feeding Abilities: Total assist Patient Positioning: Upright in bed Baseline Vocal Quality: Low vocal intensity;Hoarse;Wet (inspiratory stridor) Volitional Cough: Cognitively unable to elicit Volitional Swallow: Unable to elicit    Oral/Motor/Sensory Function Overall Oral Motor/Sensory Function: Other (comment) (unable to formally assess, appears [facility] however)   Ice Chips Ice chips: Impaired Presentation: Spoon Pharyngeal Phase Impairments: Multiple swallows;Wet Vocal Quality;Throat Clearing - Immediate;Cough - Immediate   Thin Liquid Thin Liquid: Impaired Pharyngeal  Phase Impairments: Multiple swallows;Wet Vocal Quality;Throat Clearing - Immediate;Cough - Immediate    Nectar Thick Nectar Thick Liquid: Not tested   Honey Thick Honey Thick Liquid: Not tested   Puree Puree: Impaired Presentation: Spoon Pharyngeal Phase Impairments: Multiple swallows;Wet Vocal Quality;Throat Clearing - Immediate;Cough - Immediate   Solid   GO  Gabriel Rainwater MA, CCC-SLP [phone]  Solid: Not tested        Mahalie Kanner Meryl 06/06/2015,2:38 PM

## 2015-06-07 DIAGNOSIS — F0280 Dementia in other diseases classified elsewhere without behavioral disturbance: Secondary | ICD-10-CM

## 2015-06-07 DIAGNOSIS — G309 Alzheimer's disease, unspecified: Secondary | ICD-10-CM

## 2015-06-07 MED ORDER — ACETAMINOPHEN 325 MG PO TABS
650.0000 mg | ORAL_TABLET | Freq: Four times a day (QID) | ORAL | Status: DC | PRN
Start: 2015-06-07 — End: 2015-06-13
  Administered 2015-06-07 – 2015-06-09 (×2): 650 mg via ORAL
  Filled 2015-06-07 (×2): qty 2

## 2015-06-07 NOTE — Progress Notes (Signed)
Utilization Review Completed.Janet Knight T3/08/2015  

## 2015-06-07 NOTE — Progress Notes (Signed)
SLP Cancellation Note  Patient Details Name: Janet Knight MRN: [id number] DOB: 11-15-1933   Cancelled treatment:       Reason Eval/Treat Not Completed: Fatigue/lethargy limiting ability to participate.  Per nursing the patient rouses to loud stimuli but is unable to maintain alertness.  Please page ST today if the patient is able to maintain alertness.  Current plan is for ST to follow up tomorrow.  Thank you.    Shelly Flatten, MA, [facility] SLP [phone]  Lamar Sprinkles 06/07/2015, 12:56 PM

## 2015-06-07 NOTE — Evaluation (Signed)
Physical Therapy Evaluation Patient Details Name: Janet Knight MRN: [id number] DOB: 1934/01/10 Today's Date: 06/07/2015   History of Present Illness  82 y.o. female with past medical history of dementia, hypertension, hypothyroidism who presented to [facility] ED with aggressive behavior and more altered mental status. She was found to have pneumonia on admission   Clinical Impression  The  Patient did not arouse during  Mobility to edge of bed. Will give trial of PT, if remains unresponsive, will sign off. Patient is from ALF, per note,was ambulatory. Pt admitted with above diagnosis. Pt currently with functional limitations due to the deficits listed below (see PT Problem List).  Pt will benefit from TRIAL OF skilled PT to increase mobility and decrease burden of care to  to allow discharge to the venue listed below.       Follow Up Recommendations  (depends on progress and if ALF cn provide  care.)    Equipment Recommendations  None recommended by PT    Recommendations for Other Services       Precautions / Restrictions Precautions Precautions: Fall      Mobility  Bed Mobility Overal bed mobility: +2 for physical assistance;+ 2 for safety/equipment;Needs Assistance Bed Mobility: Supine to Sit;Sit to Supine     Supine to sit: +2 for physical assistance;+2 for safety/equipment;Total assist;HOB elevated Sit to supine: +2 for safety/equipment;+2 for physical assistance;Total assist   General bed mobility comments: eyes remained closed, bed pad used to  Transfers                    Ambulation/Gait                Stairs            Wheelchair Mobility    Modified Rankin (Stroke Patients Only)       Balance Overall balance assessment: Needs assistance Sitting-balance support: Feet supported;Bilateral upper extremity supported Sitting balance-Leahy Scale: Zero                                       Pertinent Vitals/Pain Pain  Assessment: Faces Faces Pain Scale: No hurt    Home Living Family/patient expects to be discharged to:: Assisted living                 Additional Comments: per chart. patient was ambulatory at [facility] care    Prior Function           Comments: per chart, required assist for ADL's     Hand Dominance        Extremity/Trunk Assessment   Upper Extremity Assessment: RUE deficits/detail RUE Deficits / Details: somewaht rigid in arms, not  using functioanally     LUE Deficits / Details: similar as right   Lower Extremity Assessment: RLE deficits/detail;LLE deficits/detail RLE Deficits / Details: legs rigid in extension LLE Deficits / Details: same as right     Communication   Communication: Receptive difficulties;Expressive difficulties (mumbles a few sounds.)  Cognition Arousal/Alertness: Lethargic   Overall Cognitive Status: No family/caregiver present to determine baseline cognitive functioning Area of Impairment: Orientation               General Comments: patient was mute, unable to participate. Was "asleep" until near end of  session after return to supine she opened the eyes    General Comments      Exercises  Assessment/Plan    PT Assessment Patient needs continued PT services (trial, if does not arouse, then DC PT)  PT Diagnosis Difficulty walking;Altered mental status   PT Problem List Decreased strength;Decreased range of motion;Decreased activity tolerance;Decreased balance;Decreased cognition;Decreased mobility  PT Treatment Interventions DME instruction;Functional mobility training   PT Goals (Current goals can be found in the Care Plan section) Acute Rehab PT Goals PT Goal Formulation: Patient unable to participate in goal setting Time For Goal Achievement: 06/21/15 Potential to Achieve Goals: Poor    Frequency Min 2X/week   Barriers to discharge        Co-evaluation               End of Session   Activity  Tolerance: Patient limited by lethargy Patient left: in bed;with call bell/phone within reach;with bed alarm set Nurse Communication: Mobility status         Time: [id number] PT Time Calculation (min) (ACUTE ONLY): 12 min   Charges:   PT Evaluation $PT Eval Low Complexity: 1 Procedure     PT G CodesClaretha Cooper 06/07/2015, 2:59 PM Tresa Endo PT [phone]

## 2015-06-07 NOTE — Progress Notes (Addendum)
Patient ID: Janet Knight, female   DOB: May 02, 1933, 82 y.o.   MRN: [id number] [facility] PROGRESS NOTE  ADLIN OHAGAN [id number] DOB: Mar 10, 1934 DOA: 06/05/2015 PCP: Gilford Rile, MD  Brief narrative:    82 y.o. female with past medical history of dementia, hypertension, hypothyroidism who presented to [facility] ED with aggressive behavior and more altered mental status. She was found to have pneumonia on admission and was started on azithromycin and rocephin.   Assessment/Plan:    Principal Problem: Acute respiratory failure with hypoxia / Lobar pneumonia, left basilar pneumonia - Hypoxia likely due to pneumonia.  - Continue current abx, azithro and rocephin - Stable resp status - Blood cultures negative so far  - Influenza - negative  Active Problems: Essential hypertension - Continue metoprolol 12.5 mg POP BID  Alzheimer's dementia / Acute encephalopathy / Depression - Continue lexapro, Depakote, Seroquel and risperidone  - SLP eval - planned for tomorrow 3/6 - D/C to SNF likely in next 24 hours   Hypothyroidism - Continue synthroid   DVT Prophylaxis  - Lovenox subQ ordered while pt in hospital   Code Status: Full.  Family Communication:  Family not at the bedside this am Disposition Plan: to SNF likely by 06/08/2015  IV access:  Peripheral IV  Procedures and diagnostic studies:    Dg Chest 2 View 06/05/2015 Persistent left base retrocardiac atelectasis or infiltrate. No pulmonary edema. Borderline cardiomegaly.   Medical Consultants:  None   Other Consultants:  SLP PT  IAnti-Infectives:   Azithromycin and rocephin 06/05/2015 -->    Leisa Lenz, MD  [facility] Pager [phone]  Time spent in minutes: 15 minutes  If 7PM-7AM, please contact night-coverage [email or web address] Password [email or web address] 06/07/2015, 1:08 PM   LOS: 2 days    HPI/Subjective: No acute overnight events. Has mittens on for intermittent agitation.   Objective: Filed Vitals:    06/06/15 2231 06/07/15 0236 06/07/15 0612 06/07/15 1200  BP: 153/90 134/80 139/74   Pulse: 86 95 98   Temp: 97.6 F (36.4 C) 98.7 F (37.1 C) 100.9 F (38.3 C) 98.7 F (37.1 C)  TempSrc: Axillary Axillary Axillary Axillary  Resp: 18 18 18    Height:      Weight:      SpO2: 97% 96% 93%     Intake/Output Summary (Last 24 hours) at 06/07/15 1308 Last data filed at 06/07/15 0010  Gross per 24 hour  Intake      0 ml  Output      0 ml  Net      0 ml    Exam:   General:  Pt is sleeping, no acute distress   Cardiovascular: Rate controlled, appreciate S1, S2   Respiratory: no wheezing, no rhonchi   Abdomen: (+) BS, non tender   Extremities: No swelling, palpable pulses   Neuro: No focal deficits   Data Reviewed: Basic Metabolic Panel:  Recent Labs Lab 06/05/15 1555 06/06/15 0620  NA 141 135  K 4.9 4.2  CL 103 102  CO2 29 24  GLUCOSE 125* 101*  BUN 28* 13  CREATININE 0.95 0.77  CALCIUM 9.5 8.4*   Liver Function Tests:  Recent Labs Lab 06/05/15 1555 06/06/15 0620  AST 27 26  ALT 16 15  ALKPHOS 76 68  BILITOT 0.4 0.2*  PROT 7.1 6.6  ALBUMIN 3.6 3.3*   No results for input(s): LIPASE, AMYLASE in the last 168 hours. No results for input(s): AMMONIA in the last 168 hours. CBC:  Recent Labs Lab  06/05/15 1555 06/06/15 0620  WBC 6.6 5.0  NEUTROABS 4.4 2.8  HGB 12.8 13.0  HCT 40.3 40.4  MCV 95.5 95.3  PLT 308 301   Cardiac Enzymes: No results for input(s): CKTOTAL, CKMB, CKMBINDEX, TROPONINI in the last 168 hours. BNP: Invalid input(s): POCBNP CBG: No results for input(s): GLUCAP in the last 168 hours.  Urine culture     Status: None (Preliminary result)   Collection Time: 06/05/15  3:58 PM  Result Value Ref Range Status   Specimen Description URINE, CATHETERIZED  Final   Special Requests NONE  Final   Culture   Final    TOO YOUNG TO READ Performed at [facility]    Report Status PENDING  Incomplete  Culture, blood (Routine X 2) w  Reflex to ID Panel     Status: None (Preliminary result)   Collection Time: 06/05/15  8:33 PM  Result Value Ref Range Status   Specimen Description BLOOD BLOOD RIGHT HAND  Final   Special Requests IN PEDIATRIC BOTTLE [place]  Final   Culture   Final    NO GROWTH < 12 HOURS Performed at [facility]    Report Status PENDING  Incomplete  Culture, blood (Routine X 2) w Reflex to ID Panel     Status: None (Preliminary result)   Collection Time: 06/05/15  8:36 PM  Result Value Ref Range Status   Specimen Description BLOOD LEFT ANTECUBITAL  Final   Special Requests BOTTLES DRAWN AEROBIC AND ANAEROBIC 5CC  Final   Culture   Final    NO GROWTH < 12 HOURS Performed at [facility]    Report Status PENDING  Incomplete  MRSA PCR Screening     Status: None   Collection Time: 06/06/15  1:17 AM  Result Value Ref Range Status   MRSA by PCR NEGATIVE NEGATIVE Final     Scheduled Meds: . aspirin  81 mg Oral Daily  . azithromycin  500 mg Intravenous QHS  . cefTRIAXone (ROCEPHIN)  IV  1 g Intravenous QHS  . divalproex  250 mg Oral TID WC  . enoxaparin (LOVENOX) injection  40 mg Subcutaneous QHS  . escitalopram  5 mg Oral Daily  . levothyroxine  50 mcg Oral QAC breakfast  . metoprolol tartrate  12.5 mg Oral BID  . mirtazapine  7.5 mg Oral QHS  . QUEtiapine  12.5 mg Oral Q12H  . risperiDONE  1 mg Oral q morning - 10a  . tamsulosin  0.4 mg Oral BID   Continuous Infusions:

## 2015-06-08 DIAGNOSIS — E038 Other specified hypothyroidism: Secondary | ICD-10-CM

## 2015-06-08 DIAGNOSIS — J9621 Acute and chronic respiratory failure with hypoxia: Secondary | ICD-10-CM

## 2015-06-08 MED ORDER — AZITHROMYCIN 500 MG PO TABS
500.0000 mg | ORAL_TABLET | Freq: Every day | ORAL | Status: DC
  Administered 2015-06-08 – 2015-06-10 (×3): 500 mg via ORAL
  Filled 2015-06-08 (×4): qty 1

## 2015-06-08 MED ORDER — SODIUM CHLORIDE 0.9 % IV SOLN
3.0000 g | Freq: Four times a day (QID) | INTRAVENOUS | Status: DC
  Administered 2015-06-08 – 2015-06-13 (×18): 3 g via INTRAVENOUS
  Filled 2015-06-08 (×20): qty 3

## 2015-06-08 NOTE — Clinical Social Work Placement (Signed)
   CLINICAL SOCIAL WORK PLACEMENT  NOTE  Date:  06/08/2015  Patient Details  Name: Janet Knight MRN: [id number] Date of Birth: [birth year]  Clinical Social Work is seeking post-discharge placement for this patient at the [place] level of care (*CSW will initial, date and re-position this form in  chart as items are completed):  Yes   Patient/family provided with [place] Work Department's list of facilities offering this level of care within the geographic area requested by the patient (or if unable, by the patient's family).  Yes   Patient/family informed of their freedom to choose among providers that offer the needed level of care, that participate in Medicare, Medicaid or managed care program needed by the patient, have an available bed and are willing to accept the patient.  Yes   Patient/family informed of [facility]'s ownership interest in [facility] and [facility], as well as of the fact that they are under no obligation to receive care at these facilities.  PASRR submitted to EDS on       PASRR number received on       Existing PASRR number confirmed on       FL2 transmitted to all facilities in geographic area requested by pt/family on 06/08/15     FL2 transmitted to all facilities within larger geographic area on       Patient informed that his/her managed care company has contracts with or will negotiate with certain facilities, including the following:            Patient/family informed of bed offers received.  Patient chooses bed at       Physician recommends and patient chooses bed at      Patient to be transferred to   on  .  Patient to be transferred to facility by       Patient family notified on   of transfer.  Name of family member notified:        PHYSICIAN Please sign FL2     Additional Comment:    _______________________________________________ Ladell Pier, LCSW 06/08/2015, 2:44 PM

## 2015-06-08 NOTE — Progress Notes (Signed)
CSW continuing to follow.   Pt admitted from [facility] memory care unit, but will potentially need higher level of care upon discharge.   CSW spoke to [facility] memory care unit this morning and per facility pt at baseline was ambulatory and needed limited assist with ADL's. CSW discussed PT note and discussed that pt not ambulating at this time and requiring extensive assist for bed mobility. [facility] memory care anticipates pt will need rehab at [facility] before returning, but facility will come tomorrow morning to evaluate pt per pt son request.   CSW spoke with pt son regarding discharge planning. Pt son expressed understanding that pt may need higher level of care at least for a short term before returning to [place]. Pt son did confirm that he wished for [facility] to assess pt because if at all possible he would like pt to return there, but pt son is agreeable to [facility] search. Pt son discussed that he is hopeful that pt will not discharge too soon from the hospital and expressed concern about how far pt is from her baseline. Support provided.   CSW completed FL2 and initiated SNF search to [facility]   CSW to continue to follow to provide support and assist with pt disposition needs.   Alison Murray, MSW, LCSW Clinical Social Work 5E coverage  [phone]

## 2015-06-08 NOTE — Progress Notes (Signed)
PHARMACIST - PHYSICIAN COMMUNICATION DR:   Charlies Silvers CONCERNING: Antibiotic IV to Oral Route Change Policy  RECOMMENDATION: This patient is receiving azithromycin by the intravenous route.  Based on criteria approved by the Pharmacy and Therapeutics Committee, the antibiotic(s) is/are being converted to the equivalent oral dose form(s).   DESCRIPTION: These criteria include:  Patient being treated for a respiratory tract infection, urinary tract infection, cellulitis or clostridium difficile associated diarrhea if on metronidazole  The patient is not neutropenic and does not exhibit a GI malabsorption state  The patient is eating (either orally or via tube) and/or has been taking other orally administered medications for a least 24 hours  The patient is improving clinically and has a Tmax < 100.5  If you have questions about this conversion, please contact the Pharmacy Department  []   [phone] )  [facility] []   [phone] )  [facility] []   [phone] )  [facility] []   [phone] )  [facility] [x]   [phone] )  [place], PharmD, BCPS 06/08/2015 12:46 PM

## 2015-06-08 NOTE — Progress Notes (Signed)
Patient ID: Janet Knight, female   DOB: 10/09/1933, 82 y.o.   MRN: [id number] [facility] PROGRESS NOTE  Janet Knight [id number] DOB: 1933-10-28 DOA: 06/05/2015 PCP: Gilford Rile, MD  Brief narrative:    82 y.o. female with past medical history of dementia, hypertension, hypothyroidism who presented to [facility] ED with aggressive behavior and more altered mental status. She was found to have pneumonia on admission and was started on azithromycin and rocephin.  Assessment/Plan:    Principal Problem: Acute respiratory failure with hypoxia / Lobar pneumonia, left basilar pneumonia - Hypoxia likely due to pneumonia.  - Continue azithro and rocephin - Blood cultures negative so far  - Influenza - negative  Active Problems: Essential hypertension - Continue metoprolol 12.5 mg POP BID  Alzheimer's dementia / Acute encephalopathy / Depression - Continue lexapro, Depakote, Seroquel and risperidone  - SLP eval - pending   Hypothyroidism - Continue synthroid   DVT Prophylaxis  - Lovenox subQ ordered   Code Status: Full.  Family Communication:  Family not at the bedside this am, spoke with her son 3/6 Disposition Plan: to SNF likely by 06/09/2015  IV access:  Peripheral IV  Procedures and diagnostic studies:    Dg Chest 2 View 06/05/2015 Persistent left base retrocardiac atelectasis or infiltrate. No pulmonary edema. Borderline cardiomegaly.   Medical Consultants:  None   Other Consultants:  SLP PT  IAnti-Infectives:   Azithromycin and rocephin 06/05/2015 -->    Leisa Lenz, MD  [facility] Pager [phone]  Time spent in minutes: 15 minutes  If 7PM-7AM, please contact night-coverage [email or web address] Password [email or web address] 06/08/2015, 10:39 AM   LOS: 3 days    HPI/Subjective: No acute overnight events. Intermittent agitation.   Objective: Filed Vitals:   06/07/15 2039 06/08/15 0007 06/08/15 0445 06/08/15 0900  BP: 138/86 128/76 117/72 128/82  Pulse: 96 82 79  74  Temp: 98.4 F (36.9 C) 98.6 F (37 C) 98.5 F (36.9 C) 97.4 F (36.3 C)  TempSrc: Axillary Axillary Axillary Axillary  Resp: 15 15 15 18   Height:      Weight:      SpO2: 97% 95% 94% 95%    Intake/Output Summary (Last 24 hours) at 06/08/15 1039 Last data filed at 06/07/15 2136  Gross per 24 hour  Intake    300 ml  Output      0 ml  Net    300 ml    Exam:   General:  Pt is not in acute distress   Cardiovascular: RRR, (+) S1, S2   Respiratory: bilateral air entry, no wheezing   Abdomen: non tender, non distended   Extremities: No edema, palpable pulses    Neuro: Nonfocal   Data Reviewed: Basic Metabolic Panel:  Recent Labs Lab 06/05/15 1555 06/06/15 0620  NA 141 135  K 4.9 4.2  CL 103 102  CO2 29 24  GLUCOSE 125* 101*  BUN 28* 13  CREATININE 0.95 0.77  CALCIUM 9.5 8.4*   Liver Function Tests:  Recent Labs Lab 06/05/15 1555 06/06/15 0620  AST 27 26  ALT 16 15  ALKPHOS 76 68  BILITOT 0.4 0.2*  PROT 7.1 6.6  ALBUMIN 3.6 3.3*   No results for input(s): LIPASE, AMYLASE in the last 168 hours. No results for input(s): AMMONIA in the last 168 hours. CBC:  Recent Labs Lab 06/05/15 1555 06/06/15 0620  WBC 6.6 5.0  NEUTROABS 4.4 2.8  HGB 12.8 13.0  HCT 40.3 40.4  MCV 95.5 95.3  PLT 308  301   Cardiac Enzymes: No results for input(s): CKTOTAL, CKMB, CKMBINDEX, TROPONINI in the last 168 hours. BNP: Invalid input(s): POCBNP CBG: No results for input(s): GLUCAP in the last 168 hours.  Urine culture     Status: None (Preliminary result)   Collection Time: 06/05/15  3:58 PM  Result Value Ref Range Status   Specimen Description URINE, CATHETERIZED  Final   Special Requests NONE  Final   Culture   Final    TOO YOUNG TO READ Performed at [facility]    Report Status PENDING  Incomplete  Culture, blood (Routine X 2) w Reflex to ID Panel     Status: None (Preliminary result)   Collection Time: 06/05/15  8:33 PM  Result Value Ref  Range Status   Specimen Description BLOOD BLOOD RIGHT HAND  Final   Special Requests IN PEDIATRIC BOTTLE [place]  Final   Culture   Final    NO GROWTH < 12 HOURS Performed at [facility]    Report Status PENDING  Incomplete  Culture, blood (Routine X 2) w Reflex to ID Panel     Status: None (Preliminary result)   Collection Time: 06/05/15  8:36 PM  Result Value Ref Range Status   Specimen Description BLOOD LEFT ANTECUBITAL  Final   Special Requests BOTTLES DRAWN AEROBIC AND ANAEROBIC 5CC  Final   Culture   Final    NO GROWTH < 12 HOURS Performed at [facility]    Report Status PENDING  Incomplete  MRSA PCR Screening     Status: None   Collection Time: 06/06/15  1:17 AM  Result Value Ref Range Status   MRSA by PCR NEGATIVE NEGATIVE Final     Scheduled Meds: . aspirin  81 mg Oral Daily  . azithromycin  500 mg Intravenous QHS  . cefTRIAXone (ROCEPHIN)  IV  1 g Intravenous QHS  . divalproex  250 mg Oral TID WC  . enoxaparin (LOVENOX) injection  40 mg Subcutaneous QHS  . escitalopram  5 mg Oral Daily  . levothyroxine  50 mcg Oral QAC breakfast  . metoprolol tartrate  12.5 mg Oral BID  . mirtazapine  7.5 mg Oral QHS  . QUEtiapine  12.5 mg Oral Q12H  . risperiDONE  1 mg Oral q morning - 10a  . tamsulosin  0.4 mg Oral BID   Continuous Infusions:

## 2015-06-08 NOTE — Progress Notes (Signed)
Initial Nutrition Assessment  DOCUMENTATION CODES:   Underweight  INTERVENTION:  - If SLP continues to recommend NPO status and if TF within POC/GOC, recommend Jevity 1.2 @ 45 mL/hr which will provide 1296 kcal, 60 grams of protein, and 872 mL free water - Diet advancement per SLP recommendations - RD will continue to monitor for needs  NUTRITION DIAGNOSIS:   Inadequate oral intake related to inability to eat as evidenced by NPO status.  GOAL:   Patient will meet greater than or equal to 90% of their needs  MONITOR:   Diet advancement, Weight trends, Labs, I & O's  REASON FOR ASSESSMENT:   Other (Comment) (Underweight BMI)  ASSESSMENT:   82 y.o. female has a past medical history of Hypertension; Dyslipidemia; History of stress test (07/07/2009); Cancer [facility]); echocardiogram (11/2003); History of stress test (07/2009); Hypothyroid; Alzheimer's disease (04/22/2014); and Falls. Presented with altered mental status; [place] called EMS due to aggressive behavior. Patient is from dementia care assisted living at her baseline able to recognize family but alert to self only and has severe dementia.  Pt seen for underweight BMI. Pt with hx of dementia and was sleeping at time of RD visit with no family/visitors present. SLP saw pt 3/4 and recommended NPO with alternative means for nutrition. SLP to re-evaluate pt today; no note at this time. TF recommendations outlined above should they be warranted.   Unable to complete physical assessment with pt sleeping as it has been noted that she has been agitated and combative with AMS. Per chart review, pt has gained 8 lbs in the past 7.5 months; will continue to monitor weight trends.  Not meeting needs; will monitor for SLP recommendations, POC/GOC. Medications reviewed. Labs reviewed; Ca: 8.4 mg/dL.   Diet Order:   NPO  Skin:  Reviewed, no issues  Last BM:  PTA  Height:   Ht Readings from Last 1 Encounters:  06/05/15  5\' 5"  (1.651 m)    Weight:   Wt Readings from Last 1 Encounters:  06/05/15 110 lb (49.896 kg)    Ideal Body Weight:  56.82 kg (kg)  BMI:  Body mass index is 18.31 kg/(m^2).  Estimated Nutritional Needs:   Kcal:  1250-1450  Protein:  45-55 grams  Fluid:  >/= 1.5 L/day  EDUCATION NEEDS:   No education needs identified at this time     Jarome Matin, RD, LDN Inpatient Clinical Dietitian Pager # [phone] After hours/weekend pager # [phone]

## 2015-06-08 NOTE — Care Management Important Message (Signed)
Important Message  Patient Details  Name: LAVANDA TULLOCH MRN: [id number] Date of Birth: [birth year]   Medicare Important Message Given:  Yes    Camillo Flaming 06/08/2015, 4:33 [place] Message  Patient Details  Name: AUDIANA GORMLY MRN: [id number] Date of Birth: [birth year]   Medicare Important Message Given:  Yes    Camillo Flaming 06/08/2015, 4:32 PM

## 2015-06-08 NOTE — NC FL2 (Signed)
[place] LEVEL OF CARE SCREENING TOOL     IDENTIFICATION  Patient Name: Janet Knight Birthdate: 25-Nov-1933 Sex: female Admission Date (Current Location): 06/05/2015  [place] and [place] Number:  [place] Linsey [id number] [place] and Address:  [facility],  [place] [address] [place]      Provider Number: [id number]  Attending Physician Name and Address:  Robbie Lis, MD  Relative Name and Phone Number:       Current Level of Care: Hospital Recommended Level of Care: [place] Prior Approval Number:    Date Approved/Denied:   PASRR Number:    Discharge Plan: SNF    Current Diagnoses: Patient Active Problem List   Diagnosis Date Noted  . Sepsis ([place]) 06/05/2015  . CAP (community acquired pneumonia) 06/05/2015  . UTI (lower urinary tract infection) 06/05/2015  . Acute encephalopathy 06/05/2015  . Dementia   . 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 Alzheimer's disease 04/22/2014  . Essential hypertension 11/26/2012  . Hyperlipidemia 11/26/2012    Orientation RESPIRATION BLADDER Height & Weight     Self  Normal Incontinent Weight: 110 lb (49.896 kg) Height:  5\' 5"  (165.1 cm)  BEHAVIORAL SYMPTOMS/MOOD NEUROLOGICAL BOWEL NUTRITION STATUS   (pt from a [place] ALF memory care unit, dementia related behaviors, at baseline was able to ambulate around memory care unit and per son, did wander at night, but pt is not very verbally responsive at this time and was +2 for bed mobility)  (n/a) Incontinent Diet (SLP saw pt 3/4 and recommended NPO with alternative means for nutrition. SLP to re-evaluate pt today 3/6; no note at this time.)  AMBULATORY STATUS COMMUNICATION OF NEEDS Skin   Extensive Assist Verbally Normal                       Personal Care Assistance Level of Assistance  Bathing, Feeding, Dressing Bathing Assistance: Maximum assistance Feeding assistance: Limited assistance Dressing  Assistance: Maximum assistance     Functional Limitations Info  Sight, Hearing, Speech Sight Info: Adequate Hearing Info: Adequate Speech Info: Adequate    SPECIAL CARE FACTORS FREQUENCY  PT (By licensed PT), OT (By licensed OT)     PT Frequency: 5 x a week OT Frequency: 5 x a week            Contractures Contractures Info: Not present    Additional Factors Info  Psychotropic Code Status Info: FULL code status Allergies Info: Lipitor Psychotropic Info: lexapro, Depakote, Seroquel and risperidone          Current Medications (06/08/2015):  This is the current hospital active medication list Current Facility-Administered Medications  Medication Dose Route Frequency Provider Last Rate Last Dose  . acetaminophen (TYLENOL) tablet 650 mg  650 mg Oral Q6H PRN Dianne Dun, NP   650 mg at 06/07/15 [id number]  . ALPRAZolam (XANAX) tablet 0.25 mg  0.25 mg Oral TID PRN Toy Baker, MD      . aspirin chewable tablet 81 mg  81 mg Oral Daily Toy Baker, MD   81 mg at 06/08/15 1000  . azithromycin (ZITHROMAX) tablet 500 mg  500 mg Oral QHS Robbie Lis, MD      . cefTRIAXone (ROCEPHIN) 1 g in dextrose 5 % 50 mL IVPB  1 g Intravenous QHS Toy Baker, MD   1 g at 06/07/15 2136  . divalproex (DEPAKOTE SPRINKLE) capsule 250 mg  250 mg Oral TID WC Anastassia Doutova,  MD   250 mg at 06/08/15 1200  . enoxaparin (LOVENOX) injection 40 mg  40 mg Subcutaneous QHS Toy Baker, MD   40 mg at 06/07/15 2136  . escitalopram (LEXAPRO) tablet 5 mg  5 mg Oral Daily Toy Baker, MD   5 mg at 06/08/15 1000  . levothyroxine (SYNTHROID, LEVOTHROID) tablet 50 mcg  50 mcg Oral QAC breakfast Toy Baker, MD   50 mcg at 06/08/15 0800  . metoprolol tartrate (LOPRESSOR) tablet 12.5 mg  12.5 mg Oral BID Toy Baker, MD   12.5 mg at 06/08/15 1000  . mirtazapine (REMERON) tablet 7.5 mg  7.5 mg Oral QHS Toy Baker, MD   7.5 mg at 06/07/15 2136  .  QUEtiapine (SEROQUEL) tablet 12.5 mg  12.5 mg Oral Q12H Toy Baker, MD   12.5 mg at 06/08/15 1000  . risperiDONE (RISPERDAL) tablet 1 mg  1 mg Oral q morning - 10a Toy Baker, MD   1 mg at 06/08/15 1000  . tamsulosin (FLOMAX) capsule 0.4 mg  0.4 mg Oral BID Toy Baker, MD   0.4 mg at 06/08/15 1000  . traZODone (DESYREL) tablet 50 mg  50 mg Oral QHS PRN Toy Baker, MD         Discharge Medications: Please see discharge summary for a list of discharge medications.  Relevant Imaging Results:  Relevant Lab Results:   Additional Information SSN: [id number]  Dannisha Eckmann A, LCSW

## 2015-06-08 NOTE — Progress Notes (Addendum)
Speech Language Pathology Treatment: Dysphagia  Patient Details Name: DALPHINE DEMARR MRN: [id number] DOB: 1934/03/07 Today's Date: 06/08/2015 Time: [id number] SLP Time Calculation (min) (ACUTE ONLY): 39 min  Assessment / Plan / Recommendation Clinical Impression  Pt today will open her eyes and speaks *although largely incomprehensible - likely due to dementia.  Spouse and pastor present.  Pt agreeable to consume ice cream, graham crackers and water with intermittent assistance.  Timely swallow noted with cough x1 after large straw bolus of thin.  Pt's voice is clear and spouse reports she is near baseline.    Pt's spouse also reports she has been self feeding some at her facility and at times she requires assistance.   Pt leaning left = spouse reports this to be baseline.  Pt did become slightly agitated with SLP repositioning him.     Recommend start regular/thin diet - SLP to follow up x1 due to pneumonia however anticipate pt to do well with po intake.  Informed spouse to gustatory changes with dementia.  Educated spouse to findings/recommendations and posted signs with information.    SLP left mitts off patient after evaluation with RN permission as spouse is present and pt was calm during session.  Asked spouse to notify RN using call bell if pt becomes agitated, he states pt is nearing her baseline mentally.   Thanks so much.    HPI HPI: 82 year old female with PMH of Alzheimer's dementia admitted with AMS, aggressive behavior. Diagnosed with acute encephalopathy wtih evidence of HCAP. CXR with Persistent left base retrocardiac atelectasis or infiltrate.       SLP Plan  Continue with current plan of care     Recommendations  Diet recommendations: Regular;Thin liquid Liquids provided via: Cup;Straw Medication Administration: Whole meds with puree Supervision: Full supervision/cueing for compensatory strategies;Staff to assist with self feeding Compensations: Slow rate;Small  sips/bites Postural Changes and/or Swallow Maneuvers: Seated upright 90 degrees;Upright 30-60 min after meal             Oral Care Recommendations: Oral care BID Follow up Recommendations: None Plan: Continue with current plan of care     [place], [place], [place] [facility] SLP [phone]

## 2015-06-09 ENCOUNTER — Encounter (HOSPITAL_COMMUNITY): Payer: Self-pay | Admitting: Internal Medicine

## 2015-06-09 DIAGNOSIS — J9601 Acute respiratory failure with hypoxia: Secondary | ICD-10-CM | POA: Diagnosis present

## 2015-06-09 DIAGNOSIS — J181 Lobar pneumonia, unspecified organism: Secondary | ICD-10-CM | POA: Diagnosis present

## 2015-06-09 DIAGNOSIS — B952 Enterococcus as the cause of diseases classified elsewhere: Secondary | ICD-10-CM

## 2015-06-09 DIAGNOSIS — N390 Urinary tract infection, site not specified: Secondary | ICD-10-CM

## 2015-06-09 MED ORDER — LIP MEDEX EX OINT
TOPICAL_OINTMENT | CUTANEOUS | Status: AC
  Administered 2015-06-09: 05:00:00
  Filled 2015-06-09: qty 7

## 2015-06-09 NOTE — Progress Notes (Addendum)
Patient ID: Janet Knight, female   DOB: 05/20/1933, 82 y.o.   MRN: [id number] [facility] PROGRESS NOTE  Janet Knight [id number] DOB: 03-24-1934 DOA: 06/05/2015 PCP: Gilford Rile, MD  Brief narrative:    82 y.o. female with past medical history of dementia, hypertension, hypothyroidism who presented to [facility] ED with aggressive behavior and more altered mental status. She was found to have pneumonia on admission and was started on azithromycin and rocephin. Hospital course complicated with finding off enterococcus UTI. She is currently on ampicillin sulbactam for this.  Assessment/Plan:    Principal Problem: Acute respiratory failure with hypoxia / Lobar pneumonia, left basilar pneumonia - Hypoxia likely due to pneumonia.  - Continue azithro. She was also on Rocephin but because of enterococcus UTI this was changed to Unasyn 06/08/2015 - Stable respiratory status - Blood cultures negative so far  - Influenza - negative  Active Problems: Enterococcus UTI - Based on urine culture obtained at the time of the admission - Sensitive to Unasyn which was placed arch 6 2017.  Essential hypertension - Continue metoprolol 12.5 mg POP BID  Alzheimer's dementia / Acute encephalopathy / Depression - Continue lexapro, Depakote, Seroquel and risperidone  - SLP eval - regular thin liquids   Hypothyroidism - Continue synthroid   DVT Prophylaxis  - Lovenox subQ ordered in hospital   Code Status: Full.  Family Communication:  Family not at the bedside this am, spoke with her son  Disposition Plan: to SNF likely by 06/10/2015  IV access:  Peripheral IV  Procedures and diagnostic studies:    Dg Chest 2 View 06/05/2015 Persistent left base retrocardiac atelectasis or infiltrate. No pulmonary edema. Borderline cardiomegaly.   Medical Consultants:  None   Other Consultants:  SLP PT  IAnti-Infectives:   Azithromycin  06/05/2015 -->  Rocephin 06/05/2015 --> 06/08/2015 Unasyn 06/08/2015  -->  Leisa Lenz, MD  [facility] Pager [phone]  Time spent in minutes: 25 minutes  If 7PM-7AM, please contact night-coverage [email or web address] Password [email or web address] 06/09/2015, 1:20 PM   LOS: 4 days    HPI/Subjective: No acute overnight events. Intermittent restlessness.  Objective: Filed Vitals:   06/08/15 1700 06/08/15 2037 06/08/15 2307 06/09/15 0447  BP: 132/86 134/77 114/74 150/79  Pulse: 79 78 69 75  Temp: 97.7 F (36.5 C) 98.4 F (36.9 C) 97.7 F (36.5 C) 98 F (36.7 C)  TempSrc: Axillary Axillary Axillary Axillary  Resp: 16 17 17 17   Height:      Weight:      SpO2: 97% 95% 94% 99%    Intake/Output Summary (Last 24 hours) at 06/09/15 1320 Last data filed at 06/09/15 0901  Gross per 24 hour  Intake    480 ml  Output      0 ml  Net    480 ml    Exam:   General:  Pt is not in distress   Cardiovascular: Rate controlled, S1, S2 appreciated   Respiratory: no wheezing, no rhonchi   Abdomen: (+) BS, non tender   Extremities: No swelling, palpable pulses    Neuro: No focal deficits   Data Reviewed: Basic Metabolic Panel:  Recent Labs Lab 06/05/15 1555 06/06/15 0620  NA 141 135  K 4.9 4.2  CL 103 102  CO2 29 24  GLUCOSE 125* 101*  BUN 28* 13  CREATININE 0.95 0.77  CALCIUM 9.5 8.4*   Liver Function Tests:  Recent Labs Lab 06/05/15 1555 06/06/15 0620  AST 27 26  ALT 16 15  ALKPHOS 76  68  BILITOT 0.4 0.2*  PROT 7.1 6.6  ALBUMIN 3.6 3.3*   No results for input(s): LIPASE, AMYLASE in the last 168 hours. No results for input(s): AMMONIA in the last 168 hours. CBC:  Recent Labs Lab 06/05/15 1555 06/06/15 0620  WBC 6.6 5.0  NEUTROABS 4.4 2.8  HGB 12.8 13.0  HCT 40.3 40.4  MCV 95.5 95.3  PLT 308 301   Cardiac Enzymes: No results for input(s): CKTOTAL, CKMB, CKMBINDEX, TROPONINI in the last 168 hours. BNP: Invalid input(s): POCBNP CBG: No results for input(s): GLUCAP in the last 168 hours.   Results for orders placed or  performed during the hospital encounter of 06/05/15  Urine culture     Status: None (Preliminary result)   Collection Time: 06/05/15  3:58 PM  Result Value Ref Range Status   Specimen Description URINE, CATHETERIZED  Final   Special Requests NONE  Final   Culture   Final    >=100,000 COLONIES/mL ENTEROCOCCUS SPECIES Performed at [facility]    Report Status PENDING  Incomplete   Organism ID, Bacteria ENTEROCOCCUS SPECIES  Final      Susceptibility   Enterococcus species - MIC*    AMPICILLIN <=2 SENSITIVE Sensitive     LEVOFLOXACIN 0.5 SENSITIVE Sensitive     NITROFURANTOIN <=16 SENSITIVE Sensitive     VANCOMYCIN 1 SENSITIVE Sensitive     * >=100,000 COLONIES/mL ENTEROCOCCUS SPECIES  Culture, blood (Routine X 2) w Reflex to ID Panel     Status: None (Preliminary result)   Collection Time: 06/05/15  8:33 PM  Result Value Ref Range Status   Specimen Description BLOOD BLOOD RIGHT HAND  Final   Special Requests IN PEDIATRIC BOTTLE [place]  Final   Culture   Final    NO GROWTH 4 DAYS Performed at [facility]    Report Status PENDING  Incomplete  Culture, blood (Routine X 2) w Reflex to ID Panel     Status: None (Preliminary result)   Collection Time: 06/05/15  8:36 PM  Result Value Ref Range Status   Specimen Description BLOOD LEFT ANTECUBITAL  Final   Special Requests BOTTLES DRAWN AEROBIC AND ANAEROBIC 5CC  Final   Culture   Final    NO GROWTH 4 DAYS Performed at [facility]    Report Status PENDING  Incomplete  MRSA PCR Screening     Status: None   Collection Time: 06/06/15  1:17 AM  Result Value Ref Range Status   MRSA by PCR NEGATIVE NEGATIVE Final    Comment:        The GeneXpert MRSA Assay (FDA approved for NASAL specimens only), is one component of a comprehensive MRSA colonization surveillance program. It is not intended to diagnose MRSA infection nor to guide or monitor treatment for MRSA infections.        Scheduled Meds: .  ampicillin-sulbactam (UNASYN) IV  3 g Intravenous Q6H  . aspirin  81 mg Oral Daily  . azithromycin  500 mg Oral QHS  . divalproex  250 mg Oral TID WC  . enoxaparin (LOVENOX) injection  40 mg Subcutaneous QHS  . escitalopram  5 mg Oral Daily  . levothyroxine  50 mcg Oral QAC breakfast  . metoprolol tartrate  12.5 mg Oral BID  . mirtazapine  7.5 mg Oral QHS  . QUEtiapine  12.5 mg Oral Q12H  . risperiDONE  1 mg Oral q morning - 10a  . tamsulosin  0.4 mg Oral BID   Continuous  Infusions:

## 2015-06-09 NOTE — Progress Notes (Signed)
Pt lost IV access and MD was made aware of loss of access. Pt was very restless and would not allow for RN to place IV. PT has IV antibiotics but was not given the 1700 dose. Pt has no IV access at current time.  Hisashi Amadon W Kerstin Crusoe, RN

## 2015-06-09 NOTE — Progress Notes (Signed)
CSW met with the administrator of [facility]) to discuss her assessment of Pt's ability to return to the ALF. Administrator stated that Pt could return if her weakness were to improve. Pt's administrator is concerned that the Pt may try to walk (as that was her routine) and fall. If Pt can not get up and walk then Pt could still return, however they would like for the Pt to receive a wheelchair at discharge.   Administrator is awaiting PT's recommendations today. Pt's administrator for facility would like them to work on sitting up in bed and a wheelchair in conjunction with ambulation.   CSW will continue to follow Pt for discharge planning and support.   Pete Pelt [facility]  [phone]

## 2015-06-10 DIAGNOSIS — G308 Other Alzheimer's disease: Secondary | ICD-10-CM

## 2015-06-10 DIAGNOSIS — F0281 Dementia in other diseases classified elsewhere with behavioral disturbance: Secondary | ICD-10-CM

## 2015-06-10 DIAGNOSIS — J690 Pneumonitis due to inhalation of food and vomit: Secondary | ICD-10-CM

## 2015-06-10 LAB — CULTURE, BLOOD (ROUTINE X 2)
Culture: NO GROWTH
Culture: NO GROWTH

## 2015-06-10 LAB — BASIC METABOLIC PANEL
ANION GAP: 7 (ref 5–15)
BUN: 22 mg/dL — AB (ref 6–20)
CO2: 27 mmol/L (ref 22–32)
Calcium: 8.8 mg/dL — ABNORMAL LOW (ref 8.9–10.3)
Chloride: 102 mmol/L (ref 101–111)
Creatinine, Ser: 0.78 mg/dL (ref 0.44–1.00)
GFR calc Af Amer: >60 mL/min (ref >60)
GFR calc non Af Amer: >60 mL/min (ref >60)
GLUCOSE: 98 mg/dL (ref 65–99)
POTASSIUM: 4.1 mmol/L (ref 3.5–5.1)
Sodium: 136 mmol/L (ref 135–145)

## 2015-06-10 LAB — CBC
HEMATOCRIT: 39.1 % (ref 36.0–46.0)
Hemoglobin: 12.8 g/dL (ref 12.0–15.0)
MCH: 30.5 pg (ref 26.0–34.0)
MCHC: 32.7 g/dL (ref 30.0–36.0)
MCV: 93.1 fL (ref 78.0–100.0)
Platelets: 240 10*3/uL (ref 150–400)
RBC: 4.2 MIL/uL (ref 3.87–5.11)
RDW: 14.6 % (ref 11.5–15.5)
WBC: 5.8 10*3/uL (ref 4.0–10.5)

## 2015-06-10 MED ORDER — SACCHAROMYCES BOULARDII 250 MG PO CAPS: 250.0000 mg | ORAL_CAPSULE | Freq: Two times a day (BID) | ORAL | Status: AC

## 2015-06-10 MED ORDER — AMOXICILLIN-POT CLAVULANATE 875-125 MG PO TABS: 1.0000 | ORAL_TABLET | Freq: Two times a day (BID) | ORAL | Status: AC

## 2015-06-10 MED ORDER — ALPRAZOLAM 0.25 MG PO TABS: 0.2500 mg | ORAL_TABLET | Freq: Three times a day (TID) | ORAL | Status: DC | PRN

## 2015-06-10 NOTE — Discharge Summary (Addendum)
Physician Discharge Summary  Janet Knight [id number] DOB: 14-Dec-1933 DOA: 06/05/2015  PCP: Gilford Rile, MD  Admit date: 06/05/2015 Discharge date: 06/10/2015  Recommendations for Outpatient Follow-up:  1. To SNF for ongoing PT/OT  2. Continue Augmentin through 3/12, then stop 3. Palliative care to please consult at SNF 4. Follow up with Neurology within 2- 4 weeks or sooner as needed for progressive dementia  Discharge Diagnoses:  Principal Problem:   Acute respiratory failure with hypoxia ([place]) Active Problems:   Essential hypertension   Alzheimer's disease   Acute encephalopathy   Enterococcus UTI   Lobar pneumonia, unspecified organism ([place])   Aspiration pneumonia ([place])   Discharge Condition: stable, improved  Diet recommendation: Regular, thin liquid   Diet recommendations: Regular;Thin liquid Liquids provided via: Cup;Straw Medication Administration: Whole meds with puree Supervision: Full supervision/cueing for compensatory strategies;Staff to assist with self feeding Compensations: Slow rate;Small sips/bites Postural Changes and/or Swallow Maneuvers: Seated upright 90 degrees;Upright 30-60 min after meal  Oral care BID  Wt Readings from Last 3 Encounters:  06/05/15 49.896 kg (110 lb)  10/29/14 46.539 kg (102 lb 9.6 oz)  08/15/14 47.9 kg (105 lb 9.6 oz)    History of present illness:  82 year old female with history of dementia, hypertension, hypothyroidism who presented to the emergency department with altered mental status, aggressive behaviors.  Chest x-ray demonstrated pneumonia.  Her course was compensated by enterococcus UTI.  Hospital Course:   Acute respiratory failure with hypoxia secondary to left lower lobe pneumonia, likely CAP vs. Aspiration.  She was initially placed on ceftriaxone and azithromycin, however, her antibiotics were change to unasyn and azithromycin for aspiration and because her urine culture grew > 100 000 colonies of sensitive  Enterococcus.  Influenza was negative.  Blood cultures are so far no growth to date.  She was seen by speech therapy who recommended a regular diet with thin liquids and aspiration precautions.  She was stable on room air at the time of discharge and should complete a total of 7-days of treatment for her UTI and for aspiration pneumonia.   Hypoxia likely due to pneumonia.   Active Problems: Enterococcus UTI, based on urine culture obtained at the time of the admission.  UA only demonstrated a few bacteria and 6-30 WBC, so I suspect this may have been colonization, but her antibiotics for pneumonia will cover this also.  Started unasyn on 3/6.  Essential hypertension, stable, continued metoprolol 12.5 mg POP BID  Alzheimer's dementia with superimposed acute metabolic encephalopathy due to pneumonia and UTI.  Also has underlying depression.  She continued lexapro, Depakote, Seroquel and risperidone, and changed ativan to as needed instead of three times daily scheduled.  Hypothyroidism, stable, continued synthroid   Procedures and diagnostic studies:   Dg Chest 2 View 06/05/2015 Persistent left base retrocardiac atelectasis or infiltrate. No pulmonary edema. Borderline cardiomegaly.   Medical Consultants:  None   Other Consultants:  SLP PT  IAnti-Infectives:   Azithromycin 06/05/2015 -->  Rocephin 06/05/2015 --> 06/08/2015 Unasyn 06/08/2015 -->       Discharge Exam: Filed Vitals:   06/10/15 0619 06/10/15 1059  BP: 128/75 130/80  Pulse: 70   Temp: 97.6 F (36.4 C)   Resp: 16    Filed Vitals:   06/09/15 1409 06/09/15 2122 06/10/15 0619 06/10/15 1059  BP: 116/68 155/81 128/75 130/80  Pulse: 68 72 70   Temp: 98.4 F (36.9 C) 98.7 F (37.1 C) 97.6 F (36.4 C)   TempSrc: Oral Axillary Axillary  Resp: 16 18 16    Height:      Weight:      SpO2: 96% 97% 95%     General: Frail female, NAD, eyes remained closed and head turned to left during questions.  Could occasionally  answer "yes" or "no" to simple questions, but not reliably Cardiovascular: RRR, no mrg, < 2 sec CR Respiratory:  Diminished at the bilateral bases, but no focal rales, rhonchi or wheezes heard anteriorly or in axilla. ABD:  NABS, soft mildly distended, nontender MSK:  Decreased tone and bulk, no LEE  Discharge Instructions      Discharge Instructions    Call MD for:  difficulty breathing, headache or visual disturbances    Complete by:  As directed      Call MD for:  extreme fatigue    Complete by:  As directed      Call MD for:  hives    Complete by:  As directed      Call MD for:  persistant dizziness or light-headedness    Complete by:  As directed      Call MD for:  persistant nausea and vomiting    Complete by:  As directed      Call MD for:  severe uncontrolled pain    Complete by:  As directed      Call MD for:  temperature >100.4    Complete by:  As directed      Diet general    Complete by:  As directed      Increase activity slowly    Complete by:  As directed             Medication List    STOP taking these medications        azithromycin 250 MG tablet  Commonly known as:  ZITHROMAX     cholecalciferol 1000 units tablet  Commonly known as:  VITAMIN D     cyanocobalamin 1000 MCG tablet     ezetimibe 10 MG tablet  Commonly known as:  ZETIA     hydrochlorothiazide 12.5 MG capsule  Commonly known as:  MICROZIDE     sertraline 50 MG tablet  Commonly known as:  ZOLOFT      TAKE these medications        acetaminophen 500 MG tablet  Commonly known as:  TYLENOL  Take 500 mg by mouth every 4 (four) hours as needed for moderate pain.     ALPRAZolam 0.25 MG tablet  Commonly known as:  XANAX  Take 1 tablet (0.25 mg total) by mouth 3 (three) times daily as needed for anxiety.     alum & mag hydroxide-simeth 200-200-20 MG/5ML suspension  Commonly known as:  MAALOX/MYLANTA  Take 30 mLs by mouth every 6 (six) hours as needed for indigestion or heartburn.  Reported on 04/27/2015     amoxicillin-clavulanate 875-125 MG tablet  Commonly known as:  AUGMENTIN  Take 1 tablet by mouth 2 (two) times daily.     aspirin 81 MG chewable tablet  Chew 81 mg by mouth daily.     divalproex 125 MG capsule  Commonly known as:  DEPAKOTE SPRINKLE  Take 250 mg by mouth 3 (three) times daily with meals.     docusate sodium 100 MG capsule  Commonly known as:  COLACE  Take 100 mg by mouth 2 (two) times daily.     escitalopram 5 MG tablet  Commonly known as:  LEXAPRO  Take 5 mg by mouth daily.  guaifenesin 100 MG/5ML syrup  Commonly known as:  ROBITUSSIN  Take 200 mg by mouth every 6 (six) hours as needed for cough.     hydrOXYzine 25 MG tablet  Commonly known as:  ATARAX/VISTARIL  Take 25 mg by mouth every 4 (four) hours as needed for anxiety.     levothyroxine 50 MCG tablet  Commonly known as:  SYNTHROID, LEVOTHROID  Take 50 mcg by mouth daily before breakfast.     loperamide 2 MG tablet  Commonly known as:  IMODIUM A-D  Take 2 mg by mouth 4 (four) times daily as needed for diarrhea or loose stools.     magnesium hydroxide 400 MG/5ML suspension  Commonly known as:  MILK OF MAGNESIA  Take 30 mLs by mouth at bedtime as needed for mild constipation.     Melatonin 3 MG Tabs  Take 9 mg by mouth at bedtime.     metoprolol tartrate 25 MG tablet  Commonly known as:  LOPRESSOR  Take 12.5 mg by mouth 2 (two) times daily.     mirtazapine 7.5 MG tablet  Commonly known as:  REMERON  Take 7.5 mg by mouth at bedtime.     neomycin-bacitracin-polymyxin [phone] ointment  Apply 1 application topically daily as needed (skin tears and abrasions.). Apply after cleaning with normal saline, cover with band-aid or gauze and secure with tape. Changed until healed.     NUTRITIONAL DRINK PO  Take 1 each by mouth 3 (three) times daily. Mighty shakes     polyethylene glycol packet  Commonly known as:  MIRALAX / GLYCOLAX  Take 17 g by mouth daily.      QUEtiapine 25 MG tablet  Commonly known as:  SEROQUEL  Take 12.5 mg by mouth every 12 (twelve) hours.     risperiDONE 1 MG/ML oral solution  Commonly known as:  RISPERDAL  Take 1 mg by mouth every morning. In orange juice or other fluids.     rivastigmine 4.6 mg/24hr  Commonly known as:  EXELON  Place 4.6 mg onto the skin daily.     saccharomyces boulardii 250 MG capsule  Commonly known as:  FLORASTOR  Take 1 capsule (250 mg total) by mouth 2 (two) times daily.     tamsulosin 0.4 MG Caps capsule  Commonly known as:  FLOMAX  Take 0.4 mg by mouth 2 (two) times daily.     THERA Tabs  Take 1 tablet by mouth every morning.     traZODone 50 MG tablet  Commonly known as:  DESYREL  Take 50 mg by mouth at bedtime as needed for sleep.       Follow-up Information    Follow up with GRISSO,GREG, MD. Schedule an appointment as soon as possible for a visit in 1 week.   Specialty:  Internal Medicine   Contact information:   [address] [place] [address] [phone]       Follow up with Lenor Coffin, MD. Schedule an appointment as soon as possible for a visit in 1 month.   Specialty:  Neurology   Contact information:   [address] [place] [address] [phone]        The results of significant diagnostics from this hospitalization (including imaging, microbiology, ancillary and laboratory) are listed below for reference.    Significant Diagnostic Studies: Dg Chest 2 View  06/05/2015  CLINICAL DATA:  Altered mental status EXAM: CHEST  2 VIEW COMPARISON:  03/20/2015 FINDINGS: Cardiomegaly is noted. Mild elevation  of the right hemidiaphragm. Persistent left base retrocardiac atelectasis or infiltrate. No pulmonary edema. There is thoracic spine osteopenia. IMPRESSION: Persistent left base retrocardiac atelectasis or infiltrate. No pulmonary edema. Borderline cardiomegaly. Electronically Signed   By: Lahoma Crocker M.D.   On: 06/05/2015 15:43     Microbiology: Recent Results (from the past 240 hour(s))  Urine culture     Status: None (Preliminary result)   Collection Time: 06/05/15  3:58 PM  Result Value Ref Range Status   Specimen Description URINE, CATHETERIZED  Final   Special Requests NONE  Final   Culture   Final    >=100,000 COLONIES/mL ENTEROCOCCUS SPECIES Performed at [facility]    Report Status PENDING  Incomplete   Organism ID, Bacteria ENTEROCOCCUS SPECIES  Final      Susceptibility   Enterococcus species - MIC*    AMPICILLIN <=2 SENSITIVE Sensitive     LEVOFLOXACIN 0.5 SENSITIVE Sensitive     NITROFURANTOIN <=16 SENSITIVE Sensitive     VANCOMYCIN 1 SENSITIVE Sensitive     * >=100,000 COLONIES/mL ENTEROCOCCUS SPECIES  Culture, blood (Routine X 2) w Reflex to ID Panel     Status: None (Preliminary result)   Collection Time: 06/05/15  8:33 PM  Result Value Ref Range Status   Specimen Description BLOOD BLOOD RIGHT HAND  Final   Special Requests IN PEDIATRIC BOTTLE [place]  Final   Culture   Final    NO GROWTH 4 DAYS Performed at [facility]    Report Status PENDING  Incomplete  Culture, blood (Routine X 2) w Reflex to ID Panel     Status: None (Preliminary result)   Collection Time: 06/05/15  8:36 PM  Result Value Ref Range Status   Specimen Description BLOOD LEFT ANTECUBITAL  Final   Special Requests BOTTLES DRAWN AEROBIC AND ANAEROBIC 5CC  Final   Culture   Final    NO GROWTH 4 DAYS Performed at [facility]    Report Status PENDING  Incomplete  MRSA PCR Screening     Status: None   Collection Time: 06/06/15  1:17 AM  Result Value Ref Range Status   MRSA by PCR NEGATIVE NEGATIVE Final    Comment:        The GeneXpert MRSA Assay (FDA approved for NASAL specimens only), is one component of a comprehensive MRSA colonization surveillance program. It is not intended to diagnose MRSA infection nor to guide or monitor treatment for MRSA infections.      Labs: Basic  Metabolic Panel:  Recent Labs Lab 06/05/15 1555 06/06/15 0620 06/10/15 0534  NA 141 135 136  K 4.9 4.2 4.1  CL 103 102 102  CO2 29 24 27   GLUCOSE 125* 101* 98  BUN 28* 13 22*  CREATININE 0.95 0.77 0.78  CALCIUM 9.5 8.4* 8.8*   Liver Function Tests:  Recent Labs Lab 06/05/15 1555 06/06/15 0620  AST 27 26  ALT 16 15  ALKPHOS 76 68  BILITOT 0.4 0.2*  PROT 7.1 6.6  ALBUMIN 3.6 3.3*   No results for input(s): LIPASE, AMYLASE in the last 168 hours. No results for input(s): AMMONIA in the last 168 hours. CBC:  Recent Labs Lab 06/05/15 1555 06/06/15 0620 06/10/15 0534  WBC 6.6 5.0 5.8  NEUTROABS 4.4 2.8  --   HGB 12.8 13.0 12.8  HCT 40.3 40.4 39.1  MCV 95.5 95.3 93.1  PLT 308 301 240   Cardiac Enzymes: No results for input(s): CKTOTAL, CKMB, CKMBINDEX, TROPONINI in  the last 168 hours. BNP: BNP (last 3 results) No results for input(s): BNP in the last 8760 hours.  ProBNP (last 3 results) No results for input(s): PROBNP in the last 8760 hours.  CBG: No results for input(s): GLUCAP in the last 168 hours.  Time coordinating discharge: 35 minutes  Signed:  Chela Sutphen  [facility] 06/10/2015, 1:13 PM

## 2015-06-10 NOTE — Progress Notes (Signed)
CSW was informed that Pt would need to be discharged today to a SNF and not return to her ALF where she was residing. [facility])  CSW attempted to contact the administrator Lynett Fish [phone] concerning Pt's change in d/c plan and to see which facilities that they work with and left a message for a call back.   CSW also attempted to speak with the son and was unable to do so.   CSW left a message for the son to let him know that the Pt was discharged and requested that he contact CSW back to work on a discharge plan.   CSW is awaiting a call back in order to continue to work on d/c planning.   Pete Pelt MSW, [facility]  [phone]

## 2015-06-10 NOTE — Progress Notes (Signed)
Physical Therapy Treatment Patient Details Name: Janet Knight MRN: [id number] DOB: 06/14/1933 Today's Date: 06/10/2015    History of Present Illness 82 y.o. female with past medical history of dementia, hypertension, hypothyroidism who presented to [facility] ED with aggressive behavior and more altered mental status. She was found to have pneumonia on admission     PT Comments    Patient is not  Able to actively participate. Very rigid.  Follow Up Recommendations   (per chart, son requesting SNF, )     Equipment Recommendations  None recommended by PT    Recommendations for Other Services       Precautions / Restrictions Precautions Precautions: Fall    Mobility  Bed Mobility Overal bed mobility: +2 for physical assistance;+ 2 for safety/equipment;Needs Assistance Bed Mobility: Supine to Sit;Sit to Supine     Supine to sit: +2 for physical assistance;+2 for safety/equipment;Total assist;HOB elevated Sit to supine: +2 for safety/equipment;+2 for physical assistance;Total assist   General bed mobility comments: eyes remained closed, bed pad used to slide patient  to sitting and to supne  Transfers                    Ambulation/Gait                 Stairs            Wheelchair Mobility    Modified Rankin (Stroke Patients Only)       Balance   Sitting-balance support: Feet supported;No upper extremity supported Sitting balance-Leahy Scale: Zero Sitting balance - Comments: severe lateral lean Postural control: Left lateral lean                          Cognition Arousal/Alertness: Lethargic                     General Comments: x 2 said" Shut up"    Exercises      General Comments        Pertinent Vitals/Pain Pain Assessment: Faces Faces Pain Scale: No hurt    Home Living                      Prior Function            PT Goals (current goals can now be found in the care plan section) Progress  towards PT goals: Not progressing toward goals - comment (remains non verbal and does not participate.)    Frequency  Min 2X/week    PT Plan Current plan remains appropriate    Co-evaluation             End of Session   Activity Tolerance: Patient limited by lethargy Patient left: in bed;with call bell/phone within reach;with bed alarm set     Time: 1540-1549 PT Time Calculation (min) (ACUTE ONLY): 9 min  Charges:  $Therapeutic Activity: 8-22 mins                    G Codes:      Claretha Cooper 06/10/2015, 4:06 PM Tresa Endo PT [phone]

## 2015-06-11 ENCOUNTER — Inpatient Hospital Stay (HOSPITAL_COMMUNITY): Payer: Medicare HMO

## 2015-06-11 DIAGNOSIS — J690 Pneumonitis due to inhalation of food and vomit: Principal | ICD-10-CM

## 2015-06-11 LAB — BASIC METABOLIC PANEL
ANION GAP: 9 (ref 5–15)
BUN: 17 mg/dL (ref 6–20)
CHLORIDE: 101 mmol/L (ref 101–111)
CO2: 29 mmol/L (ref 22–32)
Calcium: 9 mg/dL (ref 8.9–10.3)
Creatinine, Ser: 0.74 mg/dL (ref 0.44–1.00)
GFR calc Af Amer: >60 mL/min (ref >60)
Glucose, Bld: 103 mg/dL — ABNORMAL HIGH (ref 65–99)
POTASSIUM: 4.2 mmol/L (ref 3.5–5.1)
SODIUM: 139 mmol/L (ref 135–145)

## 2015-06-11 LAB — CBC
HCT: 39.3 % (ref 36.0–46.0)
HEMOGLOBIN: 12.5 g/dL (ref 12.0–15.0)
MCH: 30 pg (ref 26.0–34.0)
MCHC: 31.8 g/dL (ref 30.0–36.0)
MCV: 94.5 fL (ref 78.0–100.0)
PLATELETS: 239 10*3/uL (ref 150–400)
RBC: 4.16 MIL/uL (ref 3.87–5.11)
RDW: 15 % (ref 11.5–15.5)
WBC: 4.7 10*3/uL (ref 4.0–10.5)

## 2015-06-11 MED ORDER — ENSURE ENLIVE PO LIQD
237.0000 mL | Freq: Two times a day (BID) | ORAL | Status: DC
Start: 2015-06-11 — End: 2015-06-13
  Administered 2015-06-12: 237 mL via ORAL

## 2015-06-11 MED ORDER — RISPERIDONE 1 MG PO TABS
1.0000 mg | ORAL_TABLET | Freq: Every day | ORAL | Status: DC
  Administered 2015-06-12: 1 mg via ORAL
  Filled 2015-06-11 (×2): qty 1

## 2015-06-11 NOTE — Progress Notes (Signed)
Nutrition Follow-up  DOCUMENTATION CODES:   Underweight  INTERVENTION:  - Will order Ensure Enlive po BID, each supplement provides 350 kcal and 20 grams of protein - Tech/RN to assist with meals as needed - RD will continue to monitor for needs until d/c  NUTRITION DIAGNOSIS:   Inadequate oral intake related to inability to eat as evidenced by NPO status. -resolving with diet advancement  GOAL:   Patient will meet greater than or equal to 90% of their needs -unmet  MONITOR:   PO intake, Supplement acceptance, Weight trends, Labs, Skin, I & O's  ASSESSMENT:   82 y.o. female has a past medical history of Hypertension; Dyslipidemia; History of stress test (07/07/2009); Cancer [facility]); echocardiogram (11/2003); History of stress test (07/2009); Hypothyroid; Alzheimer's disease (04/22/2014); and Falls. Presented with altered mental status; [place] called EMS due to aggressive behavior. Patient is from dementia care assisted living at her baseline able to recognize family but alert to self only and has severe dementia.  3/9 Diet advanced to Regular, thin liquids 3/6 at 1503 per SLP recommendation. Per chart review, pt ate bites of breakfast and lunch and 60% of dinner 3/7; 65% of breakfast yesterday (3/8). Pt sleeping at this time with no family/visitors present. Continue to be unable to do physical assessment related to pt's comfort/safety for pt with hx of dementia.  D/c to SNF order placed yesterday (3/8). Will monitor for ability to d/c and any additional nutrition-related needs until that time. Not meeting needs. Will order Ensure for until d/c. Medications reviewed. Labs reviewed.     3/6 - Pt with hx of dementia and was sleeping at time of RD visit with no family/visitors present.  - SLP saw pt 3/4 and recommended NPO with alternative means for nutrition.  - SLP to re-evaluate pt today; no note at this time.  - TF recommendations outlined above should they be  warranted.  - Unable to complete physical assessment with pt sleeping as it has been noted that she has been agitated and combative with AMS.  - Per chart review, pt has gained 8 lbs in the past 7.5 months; will continue to monitor weight trends. - Not meeting needs; will monitor for SLP recommendations, POC/GOC.   Diet Order:  Diet regular Room service appropriate?: Yes; Fluid consistency:: Thin Diet general  Skin:  Reviewed, no issues  Last BM:  3/8  Height:   Ht Readings from Last 1 Encounters:  06/05/15 5\' 5"  (1.651 m)    Weight:   Wt Readings from Last 1 Encounters:  06/05/15 110 lb (49.896 kg)    Ideal Body Weight:  56.82 kg (kg)  BMI:  Body mass index is 18.31 kg/(m^2).  Estimated Nutritional Needs:   Kcal:  1250-1450  Protein:  45-55 grams  Fluid:  >/= 1.5 L/day  EDUCATION NEEDS:   No education needs identified at this time     Jarome Matin, RD, LDN Inpatient Clinical Dietitian Pager # [phone] After hours/weekend pager # [phone]

## 2015-06-11 NOTE — Progress Notes (Signed)
14 french foley placed without complication. Yellow urine return and an intial 325 cc drained.

## 2015-06-11 NOTE — Progress Notes (Signed)
Speech Language Pathology Treatment: Dysphagia  Patient Details Name: Janet Knight MRN: [id number] DOB: Aug 10, 1933 Today's Date: 06/11/2015 Time: 0638-6854 SLP Time Calculation (min) (ACUTE ONLY): 10 min  Assessment / Plan / Recommendation Clinical Impression  Per RN, pt with approximately 75% intake of breakfast with good tolerance.  She has been sleepy and has not consumed lunch.  Pt awoke with moderate verbal/tactile stimulation from this SLP.  She willingly accepted few bites of sugar cookie, 4 ounces ice cream, and 2 ounces ginger ale.  Mildly delayed oral transiting of solids but no residuals noted.  Pt with excellent tolerance of po intake at this time.    Recommend continue regular/thin with strict precautions = encouraging pt to self feed as able for maximal airway protection.   No SLP follow up indicated at this time as family educated during initial evaluation and pt tolerating po well.     HPI HPI: 82 year old female with PMH of Alzheimer's dementia admitted with AMS, aggressive behavior. Diagnosed with acute encephalopathy wtih evidence of HCAP. CXR with Persistent left base retrocardiac atelectasis or infiltrate.       SLP Plan  All goals met     Recommendations  Diet recommendations: Regular;Thin liquid Liquids provided via: Cup;Straw Medication Administration: Whole meds with puree Supervision: Full supervision/cueing for compensatory strategies;Staff to assist with self feeding Compensations: Slow rate;Small sips/bites Postural Changes and/or Swallow Maneuvers: Seated upright 90 degrees;Upright 30-60 min after meal             Oral Care Recommendations: Oral care BID Follow up Recommendations: None Plan: All goals met     [place], [facility] SLP [phone]

## 2015-06-11 NOTE — Clinical Social Work Note (Signed)
Pt has medicaid as secondary not listed on facesheet Medicaid: [id number]  .Dede Query, LCSW [facility] Clinical Social Worker - Weekend Coverage cell #: [phone]

## 2015-06-11 NOTE — Clinical Social Work Note (Signed)
CSW reviewed pt chart and previous progress notes as well as reviewing the hub for pt snf choices.  Pt  has [employer] which requires pre authorization and no guarantee of any time frame for approving pt rehab.  Pt chart notes and the HUB reflected pt had been accepted to only a couple of snf's so the following facilities, staff and services were contacted:  CSW spoke with pt's son who stated that pt had been living at [facility] assisted living since November.  He was interested in pt getting rehab somewhere and then going back to [facility].  He reported that pt does not have much money and her only income is ss which is  $1126 monthly  She is already paid up for the month of March at her ALF and does not have any money.  CSW explained the insurance process and LOG that some SNF's will take while working on Biomedical engineer.  CSW had spoken to asst director earlier and had gained approval for a  5 day This information was also explained to pt's son providing him with the information that only some SNF's will accept an LOG.  A review of out of pocket information was also provided to pt's son.  CSW requested [facility] for SNF level and it was pushed to a review process.  Awaiting instructions from [facility] MUST before pt can be placed at SNF.   [facility] was contacted and they did not have any memory care beds for females today.  They agreed to review for placement pending a bed opening.  Once they reviewed pt was declined.  Reasons provided for denial were based on the pt's PT notes and evaluation and they stated that they did not think pt's insurance would cover for rehab.  Please review PT notes for more information.    [facility] was contacted as a possible rehab bed and although some of their facilities did accept humana and they had beds they did not accept LOG's.  [facility] and rehab, Nydia Bouton was contacted and he stated he had a bed and would accept the LOG.  An additional  conversation was held with Fransisco Beau and he stated that he started the authorization process.  [facility] later called back a third time to state that he was not sure he could accept pt based on [employer]'s request for more PT notes.  CSW had discussed obtaining more notes from PT and no additional notes could be obtained at this time that would reflect any changes.  It was reported that PT thought it was still possible for pt to be able to work toward goals that had been presented in her PT evaluation.  CSW asked Fransisco Beau to obtain additional notes once pt was admitted and he stated that he would have to check and get back to CSW.  CSW also spoke with pt's MD regarding consult for palliative and hospice services.  A referral was sent to [facility] for inpatient care..  CSW spoke with Jenny Reichmann at hospice after she had reviewed pt information and she reported there was not a bed today.  She stated  that she was not sure that pt qualified for inpatient services but there is a possible admission with Alzheimer diagnosis.  And their facility has a 6 to 8 week window so she would  have the MD review referral tomorrow morning and get back to CSW.    CSW provided all of the above information to pt's son and he reported that  he wanted to be able to give pt opportunity to work at PT even if he finds out that she cannot and that her Alzheimers diagnosis is progressing.  After discussing all of the above information pt's son agreed to pay out of pocket for some of pt's services and so the following are possible discharge plans:   Cecille Rubin at [facility] in [place] stated that she can provide pt  with either a rehab bed tomorrow or a long term (LTB) medicaid bed tomorrow.  If pt goes into the LTB PT or hospice services can be put into place once pt transitioned and the need for services was more clear.  [facility] also agreed to submit pt insurance to [facility] for possible approval for rehab but stated that this is NOT dependent on  her gaining a bed at [facility].  Pt's son is going to work out financial details with their business office tomorrow morning.  He is aware of this information and agreed to contact the buniness office tomorrow morning.    If rehab is authorized pt will need her [facility] number and CSW will continue to monitor this and coordinate information between the pt's son, the facility and the facility's business office.     Dede Query, LCSW [place] Worker - Weekend Coverage cell #: [phone]

## 2015-06-11 NOTE — Progress Notes (Signed)
[facility] PROGRESS NOTE  Janet Knight [id number] DOB: May 30, 1933 DOA: 06/05/2015 PCP: Gilford Rile, MD  Brief Summary  82 year old female with history of dementia, hypertension, hypothyroidism who presented to the emergency department with altered mental status, aggressive behaviors. Chest x-ray demonstrated pneumonia. She has had poor level of alertness and decreased oral intake since admission.  She has been lying in the fetal position and refusing to work with physical and occupational therapy and does not speak or communicate.  Unfortunately, she is too frail and requires too much assistance for ADLs to safely go back to her ALF.  Her family does not have enough money to pay for 24h aid.  Because her family members work and cannot afford to quit their jobs, she cannot be taken home with family and again they cannot afford a 24 hour aid.  She is not qualifying for acute rehab at SNF because her illness and dementia limit her ability to participate in PT/OT.  She would be custodial care at a SNF and the social worker has been having problems with the insurance company getting coverage for placement for this indication.  Because she has continued to eat well and has had stable to improved weight, despite her pneumonia and current condition, hospice is unlikely to accept her to residential hospice and she may not even qualify for hospice care altogether.    Assessment/Plan  Acute respiratory failure with hypoxia secondary to left lower lobe pneumonia, likely CAP vs. Aspiration. She was initially placed on ceftriaxone and azithromycin, however, her antibiotics were change to unasyn and azithromycin for aspiration. She completed more than 1.5gm of azithromycin and will complete her unasyn course on 3/12.  Influenza was negative. Blood cultures are so far no growth to date. She was seen by speech therapy who recommended a regular diet with thin liquids and aspiration precautions. She is  now stable on room air.    Alzheimer's dementia with superimposed acute metabolic encephalopathy due to pneumonia and UTI. Also has underlying depression. She continued lexapro, Depakote, Seroquel and risperidone, and changed ativan to as needed instead of three times daily scheduled.  She has remained lethargic and poorly responsive despite many days of antibiotics and supportive care.  I will change her risperidone to nightly doses and discontinue her PRN sedating medications for now.  CT head to exclude large stroke because I do not think she will lie still for MRI at this juncture.  Doubt seizure.  I think her dementia is progressing and I have discussed this with her son.  We also discussed overall GOC, including code status.  I have recommended DNR/DNI and he is going to consider this option.  I have also placed a palliative care consultation.    Possible Enterococcus UTI, based on urine culture obtained at the time of the admission. UA only demonstrated a few bacteria and 6-30 WBC, so I suspect this may have been colonization, but her antibiotics for pneumonia will cover this also. Started unasyn on 3/6.  Essential hypertension, stable, continued metoprolol 12.5 mg POP BID  Hypothyroidism, stable, continued synthroid  Diet:  regular Access:  PIV IVF:  off Proph:  lovenox  Code Status: full code Family Communication: patient and her son Disposition Plan:  Awaiting PASSR and insurance approval for possible SNF.  CT head.  Palliative care consultation and hospice evaluation.     Consultants:  Palliative care consultation  Procedures:  CT head  Antibiotics: Azithromycin 06/05/2015 --> 3/9 Rocephin 06/05/2015 --> 06/08/2015 Unasyn 06/08/2015 -->  HPI/Subjective:  Unable to obtain, patient confused and demented.    Objective: Filed Vitals:   06/11/15 0013 06/11/15 0429 06/11/15 0820 06/11/15 1000  BP: 159/80 150/76 145/92 127/66  Pulse: 64 61 62 69  Temp: 97.8 F (36.6 C)  98.8 F (37.1 C) 97.6 F (36.4 C)   TempSrc: Axillary Axillary Axillary   Resp: 17 17 15    Height:      Weight:      SpO2: 95% 95% 96%     Intake/Output Summary (Last 24 hours) at 06/11/15 1307 Last data filed at 06/10/15 1900  Gross per 24 hour  Intake    220 ml  Output      0 ml  Net    220 ml   Filed Weights   06/05/15 2058  Weight: 49.896 kg (110 lb)   Body mass index is 18.31 kg/(m^2).  Exam:   General:  Adult female, curled in fetal position on left side in bed, refuses to open eyes or answer questions.    HEENT:  NCAT, MMM  Cardiovascular:  RRR, nl S1, S2 no mrg, 2+ pulses, warm extremities  Respiratory:  CTAB, no increased WOB  Abdomen:   NABS, soft, mildly distended.  Tells me to "stop that" when I press on her belly  MSK:   Decreased tone and bulk, no LEE  Neuro:  Grossly moves right sided extremities.  Data Reviewed: Basic Metabolic Panel:  Recent Labs Lab 06/05/15 1555 06/06/15 0620 06/10/15 0534 06/11/15 0511  NA 141 135 136 139  K 4.9 4.2 4.1 4.2  CL 103 102 102 101  CO2 29 24 27 29   GLUCOSE 125* 101* 98 103*  BUN 28* 13 22* 17  CREATININE 0.95 0.77 0.78 0.74  CALCIUM 9.5 8.4* 8.8* 9.0   Liver Function Tests:  Recent Labs Lab 06/05/15 1555 06/06/15 0620  AST 27 26  ALT 16 15  ALKPHOS 76 68  BILITOT 0.4 0.2*  PROT 7.1 6.6  ALBUMIN 3.6 3.3*   No results for input(s): LIPASE, AMYLASE in the last 168 hours. No results for input(s): AMMONIA in the last 168 hours. CBC:  Recent Labs Lab 06/05/15 1555 06/06/15 0620 06/10/15 0534 06/11/15 0511  WBC 6.6 5.0 5.8 4.7  NEUTROABS 4.4 2.8  --   --   HGB 12.8 13.0 12.8 12.5  HCT 40.3 40.4 39.1 39.3  MCV 95.5 95.3 93.1 94.5  PLT 308 301 240 239    Recent Results (from the past 240 hour(s))  Urine culture     Status: None (Preliminary result)   Collection Time: 06/05/15  3:58 PM  Result Value Ref Range Status   Specimen Description URINE, CATHETERIZED  Final   Special Requests  NONE  Final   Culture   Final    >=100,000 COLONIES/mL ENTEROCOCCUS SPECIES A SECOND MORPHOLOGICALLY DIFFERENT ENTEROCOCCUS SPECIES PRESENT BUT UNABLE TO OBTAIN SENSITIVITIES SENDING TO REFERENCE LAB IN CASE SENSITIVITIES DIFFERENT FROM ORGANISM 1 Performed at [facility]    Report Status PENDING  Incomplete   Organism ID, Bacteria ENTEROCOCCUS SPECIES  Final      Susceptibility   Enterococcus species - MIC*    AMPICILLIN <=2 SENSITIVE Sensitive     LEVOFLOXACIN 0.5 SENSITIVE Sensitive     NITROFURANTOIN <=16 SENSITIVE Sensitive     VANCOMYCIN 1 SENSITIVE Sensitive     * >=100,000 COLONIES/mL ENTEROCOCCUS SPECIES  Culture, blood (Routine X 2) w Reflex to ID Panel     Status: None   Collection Time:  06/05/15  8:33 PM  Result Value Ref Range Status   Specimen Description BLOOD BLOOD RIGHT HAND  Final   Special Requests IN PEDIATRIC BOTTLE 1CC  Final   Culture   Final    NO GROWTH 5 DAYS Performed at [facility]    Report Status 06/10/2015 FINAL  Final  Culture, blood (Routine X 2) w Reflex to ID Panel     Status: None   Collection Time: 06/05/15  8:36 PM  Result Value Ref Range Status   Specimen Description BLOOD LEFT ANTECUBITAL  Final   Special Requests BOTTLES DRAWN AEROBIC AND ANAEROBIC 5CC  Final   Culture   Final    NO GROWTH 5 DAYS Performed at [facility]    Report Status 06/10/2015 FINAL  Final  MRSA PCR Screening     Status: None   Collection Time: 06/06/15  1:17 AM  Result Value Ref Range Status   MRSA by PCR NEGATIVE NEGATIVE Final    Comment:        The GeneXpert MRSA Assay (FDA approved for NASAL specimens only), is one component of a comprehensive MRSA colonization surveillance program. It is not intended to diagnose MRSA infection nor to guide or monitor treatment for MRSA infections.      Studies: No results found.  Scheduled Meds: . ampicillin-sulbactam (UNASYN) IV  3 g Intravenous Q6H  . aspirin  81 mg Oral Daily  .  azithromycin  500 mg Oral QHS  . divalproex  250 mg Oral TID WC  . enoxaparin (LOVENOX) injection  40 mg Subcutaneous QHS  . escitalopram  5 mg Oral Daily  . feeding supplement (ENSURE ENLIVE)  237 mL Oral BID BM  . levothyroxine  50 mcg Oral QAC breakfast  . metoprolol tartrate  12.5 mg Oral BID  . mirtazapine  7.5 mg Oral QHS  . QUEtiapine  12.5 mg Oral Q12H  . risperiDONE  1 mg Oral q morning - 10a  . tamsulosin  0.4 mg Oral BID   Continuous Infusions:   Principal Problem:   Acute respiratory failure with hypoxia (HCC) Active Problems:   Essential hypertension   Alzheimer's disease   Acute encephalopathy   Enterococcus UTI   Lobar pneumonia, unspecified organism ([place])   Aspiration pneumonia ([place])    Time spent: 30 min    Janet Knight, [place] Hospital[facility] Pager [phone]. If 7PM-7AM, please contact night-coverage at [email or web address], password [facility] 06/11/2015, 1:07 PM  LOS: 6 days

## 2015-06-12 DIAGNOSIS — Z515 Encounter for palliative care: Secondary | ICD-10-CM

## 2015-06-12 DIAGNOSIS — Z7189 Other specified counseling: Secondary | ICD-10-CM

## 2015-06-12 MED ORDER — DEXTROSE-NACL 5-0.45 % IV SOLN
INTRAVENOUS | Status: DC
  Administered 2015-06-12 – 2015-06-13 (×2): via INTRAVENOUS

## 2015-06-12 NOTE — Care Management Important Message (Signed)
Important Message  Patient Details  Name: AYDE SORACE MRN: [id number] Date of Birth: 01-18-1934   Medicare Important Message Given:  Yes    Camillo Flaming 06/12/2015, 2:18 [place] Message  Patient Details  Name: KRISTALYNN BITNER MRN: [id number] Date of Birth: [birth year]   Medicare Important Message Given:  Yes    Camillo Flaming 06/12/2015, 2:18 PM

## 2015-06-12 NOTE — Clinical Social Work Note (Signed)
PASARR has been received for pt.  [facility] had originally agreed to accept pt but now is not sure whether they will be able to accept pt today due to numerous cases of the flu at the facility.  CSW spoke to [place], [place] who explained that pt's family would like for pt to return to the ALF.  CSW informed PA that [facility] is not willing to have pt to return to their facility because they are no longer able to meet her needs.  [name], [place] explained that pt could benefit from being followed by hospice at the LTC facility.  CSW is in the process of confirming facility placement and will provide a list of hospice facilities to the family.  [name], [place]

## 2015-06-12 NOTE — Progress Notes (Addendum)
CSW spoke with admissions of [facility] who states patient is welcomed to come facility. CSW Also made son/Craig aware that patient will be coming to facility. Son is accepting.  CSW made nurse aware, and provided nurse with report number to [phone]. CSW provided nurse with pack  CSW called [facility] for transportation. CSW made nurse aware. [facility] states it will be possibly be a 3 hour wait upon arrival to pick up patient. CSW made nurse aware, and gave her packet to go with patient upon discharge with required documents.   Willette Brace [id number] ED CSW 06/12/2015 4:50 PM

## 2015-06-12 NOTE — Consult Note (Signed)
Consultation Note Date: 06/12/2015   Patient Name: Janet Knight  DOB: 28-Aug-1933  MRN: [id number]  Age / Sex: 82 y.o., female  PCP: Janet Mina, MD Referring Physician: Janece Canterbury, MD  Reason for Consultation: Establishing goals of care  82 yo female with Alzheimer's disease, cared for by Dr. Jannifer Knight of neurology.  She developed altered mental status on 3/3 and was found to have left lower lobe pneumonia (either CAP or Aspiration).    Clinical Assessment/Narrative:  I met with Janet Knight (son, HCPOA) and husband, Janet Knight.  Janet Knight tells me that his mother's baseline at [facility]/memory care was walking the halls frequently, feeding herself (often using her hands), with ocassional behavioral disturbances - usually when being changed or bathed.  Her speech is often unintelligible.  During this hospitalization she has remained in bed, eating very little and interacting with the staff very little.  This has improved somewhat today - she is eating better.  The patient has a living will that expresses the desire for no life support.  Janet Knight will attempt to bring the living will to the hospital to have a copy placed on her chart during his next visit. We discussed code status - and both Janet Knight and his father were comfortable foregoing CPR, defibrillation, and intubation.  They elected partial code.  Janet Knight questioned the sudden change in his mother's presentation and thought their must be some addition explanation for it other than infection as the pneumonia is beginning to resolve.  We discussed the trajectory of Alzheimer's dementia and the sudden steep drops in function associated with this disease.  Janet Knight feels strongly that his mother should have physical therapy.  He wants her to have a chance to regain her mobility - walking the halls at [place] was a great source of enjoyment for her.  He and his father were receptive  to the idea of hospice services and eventually using the hospice home of [facility] when Janet Knight's time is very short.  According to FAST 7C criteria for Alzheimer's dementia the patient is likely hospice eligible. She is speaking less than 6 intelligible words a day.  She is unable to walk without assistance.  She can not sit up without assistance and will fall over if there are no arm rests to support her weight.  Her has has fallen to the left and she appears unable to hold it up independently.    Contacts/Participants in Discussion:  Son Janet Knight, Husband Janet Knight HCPOA:  Janet Knight  SUMMARY OF RECOMMENDATIONS  Code Status/Advance Care Planning: Limited code    Other Directives:Living Will, Advanced Directive  Palliative Prophylaxis:   Aspiration, Delirium Protocol and Turn Reposition  Additional Recommendations (Limitations, Scope, Preferences):  Full Scope Treatment   Offer Choice of Hospice Services to the family which will allow them to speak with hospice and gain information about benefits.   Psycho-social/Spiritual:  Support System: Strong  Prognosis:  Given advanced Alzheimer's Dementia, likely less than 6 months.  Discharge Planning: [place] with Hospice vs ALF with Hospice.   Chief Complaint/ Primary Diagnoses: Present on Admission:  . Essential hypertension . Acute encephalopathy . Enterococcus UTI . Lobar pneumonia, unspecified organism ([place]) . Acute respiratory failure with hypoxia ([place]) . Alzheimer's disease  I have reviewed the medical record, interviewed the patient and family, and examined the patient. The following aspects are pertinent.  History reviewed. No pertinent past medical history. Social History   Social History  . Marital Status: Married  Spouse Name: N/A  . Number of Children: 2  . Years of Education: N/A   Occupational History  . retired    Social History Main Topics  . Smoking status: Never Smoker    . Smokeless tobacco: Never Used  . Alcohol Use: No  . Drug Use: No  . Sexual Activity: Not Asked   Other Topics Concern  . None   Social History Narrative   Patient is right handed   Patient occasionally drinks caffeine.   Family History  Problem Relation Age of Onset  . Heart attack Father 33  . Heart attack Brother   . Heart disease Brother   . Heart failure Mother   . Cancer Sister     liver  . Dementia Neg Hx    Scheduled Meds: . ampicillin-sulbactam (UNASYN) IV  3 g Intravenous Q6H  . aspirin  81 mg Oral Daily  . divalproex  250 mg Oral TID WC  . enoxaparin (LOVENOX) injection  40 mg Subcutaneous QHS  . escitalopram  5 mg Oral Daily  . feeding supplement (ENSURE ENLIVE)  237 mL Oral BID BM  . levothyroxine  50 mcg Oral QAC breakfast  . metoprolol tartrate  12.5 mg Oral BID  . mirtazapine  7.5 mg Oral QHS  . QUEtiapine  12.5 mg Oral Q12H  . risperiDONE  1 mg Oral QHS  . tamsulosin  0.4 mg Oral BID   Continuous Infusions:  PRN Meds:.acetaminophen Medications Prior to Admission:  Prior to Admission medications   Medication Sig Start Date End Date Taking? Authorizing Provider  aspirin 81 MG chewable tablet Chew 81 mg by mouth daily.   Yes Historical Provider, MD  cholecalciferol (VITAMIN D) 1000 UNITS tablet Take 2,000 Units by mouth every morning. 01/29/15 01/29/16 Yes Historical Provider, MD  cyanocobalamin 1000 MCG tablet Take 1,000 mcg by mouth every morning.   Yes Historical Provider, MD  divalproex (DEPAKOTE SPRINKLE) 125 MG capsule Take 250 mg by mouth 3 (three) times daily with meals. 01/29/15 01/29/16 Yes Historical Provider, MD  docusate sodium (COLACE) 100 MG capsule Take 100 mg by mouth 2 (two) times daily.   Yes Historical Provider, MD  escitalopram (LEXAPRO) 5 MG tablet Take 5 mg by mouth daily.   Yes Historical Provider, MD  levothyroxine (SYNTHROID, LEVOTHROID) 50 MCG tablet Take 50 mcg by mouth daily before breakfast.   Yes Historical Provider, MD   Melatonin 3 MG TABS Take 9 mg by mouth at bedtime. 01/29/15  Yes Historical Provider, MD  metoprolol tartrate (LOPRESSOR) 25 MG tablet Take 12.5 mg by mouth 2 (two) times daily.    Yes Historical Provider, MD  mirtazapine (REMERON) 7.5 MG tablet Take 7.5 mg by mouth at bedtime.   Yes Historical Provider, MD  Multiple Vitamin (THERA) TABS Take 1 tablet by mouth every morning. 01/29/15  Yes Historical Provider, MD  Nutritional Supplements (NUTRITIONAL DRINK PO) Take 1 each by mouth 3 (three) times daily. Mighty shakes   Yes Historical Provider, MD  polyethylene glycol (MIRALAX / GLYCOLAX) packet Take 17 g by mouth daily.   Yes Historical Provider, MD  QUEtiapine (SEROQUEL) 25 MG tablet Take 12.5 mg by mouth every 12 (twelve) hours.   Yes Historical Provider, MD  risperiDONE (RISPERDAL) 1 MG/ML oral solution Take 1 mg by mouth every morning. In orange juice or other fluids.   Yes Historical Provider, MD  rivastigmine (EXELON) 4.6 mg/24hr Place 4.6 mg onto the skin daily.   Yes Historical Provider, MD  tamsulosin [facility])  0.4 MG CAPS capsule Take 0.4 mg by mouth 2 (two) times daily. 01/29/15  Yes Historical Provider, MD  acetaminophen (TYLENOL) 500 MG tablet Take 500 mg by mouth every 4 (four) hours as needed for moderate pain.    Historical Provider, MD  ALPRAZolam Duanne Moron) 0.25 MG tablet Take 1 tablet (0.25 mg total) by mouth 3 (three) times daily as needed for anxiety. 06/10/15   Janet Canterbury, MD  alum & mag hydroxide-simeth (MAALOX/MYLANTA) 200-200-20 MG/5ML suspension Take 30 mLs by mouth every 6 (six) hours as needed for indigestion or heartburn. Reported on 04/27/2015    Historical Provider, MD  amoxicillin-clavulanate (AUGMENTIN) 875-125 MG tablet Take 1 tablet by mouth 2 (two) times daily. 06/10/15   Janet Canterbury, MD  azithromycin (ZITHROMAX) 250 MG tablet Take 1 tablet (250 mg total) by mouth daily. Take 1 every day until finished. Patient not taking: Reported on 04/27/2015 03/20/15   Lacretia Leigh, MD  ezetimibe (ZETIA) 10 MG tablet Take 1 tablet (10 mg total) by mouth daily. NEED APPOINTMENT FOR FUTURE REFILLS. Patient not taking: Reported on 03/20/2015 03/12/14   Lorretta Harp, MD  guaifenesin (ROBITUSSIN) 100 MG/5ML syrup Take 200 mg by mouth every 6 (six) hours as needed for cough.    Historical Provider, MD  hydrochlorothiazide (MICROZIDE) 12.5 MG capsule TAKE 1 CAPSULE BY MOUTH DAILY. Patient not taking: Reported on 01/31/2015 06/13/14   Lorretta Harp, MD  hydrOXYzine (ATARAX/VISTARIL) 25 MG tablet Take 25 mg by mouth every 4 (four) hours as needed for anxiety.    Historical Provider, MD  loperamide (IMODIUM A-D) 2 MG tablet Take 2 mg by mouth 4 (four) times daily as needed for diarrhea or loose stools.    Historical Provider, MD  magnesium hydroxide (MILK OF MAGNESIA) 400 MG/5ML suspension Take 30 mLs by mouth at bedtime as needed for mild constipation.    Historical Provider, MD  neomycin-bacitracin-polymyxin (NEOSPORIN) [phone] ointment Apply 1 application topically daily as needed (skin tears and abrasions.). Apply after cleaning with normal saline, cover with band-aid or gauze and secure with tape. Changed until healed.    Historical Provider, MD  saccharomyces boulardii (FLORASTOR) 250 MG capsule Take 1 capsule (250 mg total) by mouth 2 (two) times daily. 06/10/15   Janet Canterbury, MD  sertraline (ZOLOFT) 50 MG tablet TAKE 1 TABLET BY MOUTH DAILY. Patient not taking: Reported on 01/31/2015 06/12/14   Kathrynn Ducking, MD  traZODone (DESYREL) 50 MG tablet Take 50 mg by mouth daily as needed for sleep. sleep 01/29/15 02/28/15  Historical Provider, MD  traZODone (DESYREL) 50 MG tablet Take 50 mg by mouth at bedtime as needed for sleep.    Historical Provider, MD   Allergies  Allergen Reactions  . Lipitor [Atorvastatin]     myalgias    Review of Systems:  Unable  Physical Exam  Frail, Cachectic female, Leaning over to the left.  Sleeping Awakens to touch,  Unable  to lift her head.  Speaking gibberish. CV  RRR Resp:  No increased work of breathing. CTA Extremities: No edema.  Able to move all 4.  Vital Signs: BP 94/57 mmHg  Pulse 76  Temp(Src) 98.2 F (36.8 C) (Axillary)  Resp 18  Ht 5' 5"  (1.651 m)  Wt 49.896 kg (110 lb)  BMI 18.31 kg/m2  SpO2 96%  SpO2: SpO2: 96 % O2 Device:SpO2: 96 % O2 Flow Rate: .   IO: Intake/output summary:  Intake/Output Summary (Last 24 hours) at 06/12/15 1345 Last data filed at  06/12/15 1122  Gross per 24 hour  Intake      0 ml  Output   1025 ml  Net  -1025 ml    LBM: Last BM Date: 06/12/15 Baseline Weight: Weight: 49.896 kg (110 lb) Most recent weight: Weight: 49.896 kg (110 lb)      Palliative Assessment/Data:  Flowsheet Rows        Most Recent Value   Intake Tab    Referral Department  Hospitalist   Unit at Time of Referral  Med/Surg Unit   Palliative Care Primary Diagnosis  Neurology   Date Notified  06/11/15   Palliative Care Type  New Palliative care   Reason for referral  Clarify Goals of Care   Date of Admission  06/05/15   # of days IP prior to Palliative referral  6   Clinical Assessment    Psychosocial & Spiritual Assessment    Palliative Care Outcomes       Additional Data Reviewed:  CBC:    Component Value Date/Time   WBC 4.7 06/11/2015 0511   HGB 12.5 06/11/2015 0511   HCT 39.3 06/11/2015 0511   PLT 239 06/11/2015 0511   MCV 94.5 06/11/2015 0511   NEUTROABS 2.8 06/06/2015 0620   LYMPHSABS 1.2 06/06/2015 0620   MONOABS 1.0 06/06/2015 0620   EOSABS 0.0 06/06/2015 0620   BASOSABS 0.0 06/06/2015 0620   Comprehensive Metabolic Panel:    Component Value Date/Time   NA 139 06/11/2015 0511   NA 139 08/15/2014 1150   K 4.2 06/11/2015 0511   CL 101 06/11/2015 0511   CO2 29 06/11/2015 0511   BUN 17 06/11/2015 0511   BUN 17 08/15/2014 1150   CREATININE 0.74 06/11/2015 0511   GLUCOSE 103* 06/11/2015 0511   GLUCOSE 84 08/15/2014 1150   CALCIUM 9.0 06/11/2015 0511   AST  26 06/06/2015 0620   ALT 15 06/06/2015 0620   ALKPHOS 68 06/06/2015 0620   BILITOT 0.2* 06/06/2015 0620   PROT 6.6 06/06/2015 0620   ALBUMIN 3.3* 06/06/2015 0620     Time In: 1:00 Time Out: 2:20 Time Total: 80 min. Greater than 50%  of this time was spent counseling and coordinating care related to the above assessment and plan.  Signed by: Olga Coaster, PA-C  06/12/2015, 1:45 PM  Please contact Palliative Medicine Team phone at [phone] for questions and concerns.

## 2015-06-12 NOTE — Progress Notes (Signed)
Physical Therapy Treatment Patient Details Name: OLIMPIA DEMARSE MRN: [id number] DOB: [birth year] Today's Date: 06/12/2015    History of Present Illness 82 y.o. female with past medical history of dementia, hypertension, hypothyroidism who presented to [facility] ED with aggressive behavior and more altered mental status. She was found to have pneumonia on admission     PT Comments    Patient is unchanged in ability to participate.   Follow Up Recommendations   (currently unable to progress due to MS.)     Equipment Recommendations  None recommended by PT    Recommendations for Other Services       Precautions / Restrictions Restrictions Weight Bearing Restrictions: No    Mobility  Bed Mobility               General bed mobility comments: patient aroused with stimulation, mumbling, clinching hands  on blanket. Patient is unchanged from yesterday in ability to follow commands and participate. RN reflects the same condition.  Transfers                    Ambulation/Gait                 Stairs            Wheelchair Mobility    Modified Rankin (Stroke Patients Only)       Balance                                    Cognition                            Exercises      General Comments        Pertinent Vitals/Pain      Home Living                      Prior Function            PT Goals (current goals can now be found in the care plan section) Progress towards PT goals: Not progressing toward goals - unable to participate   Frequency       PT Plan Current plan remains appropriate (needs  total care )    Co-evaluation             End of Session   Activity Tolerance: Patient limited by lethargy;Other (comment) (unable to participate) Patient left: in bed;with call bell/phone within reach;with nursing/sitter in room     Time: 1140-     Charges:    No charges                   G  Codes:      Claretha Cooper 06/12/2015, 12:28 PM

## 2015-06-12 NOTE — Discharge Summary (Signed)
Physician Discharge Summary  Janet Knight QJJ:[id number] DOB: [birth year] DOA: 06/05/2015  PCP: Gilford Rile, MD  Admit date: 06/05/2015 Discharge date: 06/13/2015  Recommendations for Outpatient Follow-up:  1. To SNF for ongoing PT/OT  2. Continue Augmentin through 3/12, then stop 3. Palliative care to please consult at SNF 4. Follow up with Neurology within 2- 4 weeks or sooner as needed for progressive dementia 5. Please schedule follow up with urology for intermittent urinary retention  CODE STATUS:  Limited code.  No CPR, intubation or debrillation.  Okay to use ACLS medications or bipap, however.    Discharge Diagnoses:  Principal Problem:   Acute respiratory failure with hypoxia ([place]) Active Problems:   Essential hypertension   Alzheimer's disease   Acute encephalopathy   Enterococcus UTI   Lobar pneumonia, unspecified organism ([place])   Aspiration pneumonia [facility])   Palliative care encounter   Encounter for hospice care discussion   Counseling regarding advanced directives and goals of care   Discharge Condition: stable, improved  Diet recommendation: Regular, thin liquid   Diet recommendations: Regular;Thin liquid Liquids provided via: Cup;Straw Medication Administration: Whole meds with puree Supervision: Full supervision/cueing for compensatory strategies;Staff to assist with self feeding Compensations: Slow rate;Small sips/bites Postural Changes and/or Swallow Maneuvers: Seated upright 90 degrees;Upright 30-60 min after meal  Oral care BID  Wt Readings from Last 3 Encounters:  06/05/15 49.896 kg (110 lb)  10/29/14 46.539 kg (102 lb 9.6 oz)  08/15/14 47.9 kg (105 lb 9.6 oz)    History of present illness:  82 year old female with history of dementia, hypertension, hypothyroidism who presented to the emergency department with altered mental status, aggressive behaviors.  Chest x-ray demonstrated pneumonia.  Her course was compensated by enterococcus  UTI.  Hospital Course:   Acute respiratory failure with hypoxia secondary to left lower lobe pneumonia, likely CAP vs. Aspiration.  She was initially placed on ceftriaxone and azithromycin, however, her antibiotics were change to unasyn and azithromycin for aspiration and because her urine culture grew > 100 000 colonies of sensitive Enterococcus.  Influenza was negative.  Blood cultures are so far no growth to date.  She was seen by speech therapy who recommended a regular diet with thin liquids and aspiration precautions.  She was stable on room air at the time of discharge and should complete a total of 7-days of treatment for her UTI and for aspiration pneumonia.   Hypoxia likely due to pneumonia.   Active Problems: Enterococcus UTI, based on urine culture obtained at the time of the admission.  UA only demonstrated a few bacteria and 6-30 WBC, so I suspect this may have been colonization, but her antibiotics for pneumonia will cover this also.  Started unasyn on 3/6.  Alzheimer's dementia with superimposed acute metabolic encephalopathy due to pneumonia and UTI.  Also has underlying depression.  She continued lexapro, Depakote, Seroquel and risperidone, but she did not require xanax or ativan or trazodone during her stay so I discontinued these medications from her MAR.  I also scheduled her risperidone to be given at night instead of in the morning to reduce daytime sedation.  Head CT demonstrated no acute changes.  She had improvement in her mentation during her stay, but she did not recover to her previous baseline which was described as having at least some limited ability to communicate and able to move semi-independently around her assisted living facility.  She was not able to participate well with physical and occupational therapy at this time, however, at  her baseline, she was much more physically functional and she may recover enough to attempt PT/OT once she has had some additional time to  recover from her pneumonia.  She and her son met with palliative care and discussed prognosis and GOC.  Palliative care should continue to follow at her facility to support the family and make sure she remains comfortable, however, at this time, the son would really like his mother to do some rehabilitation.    Acute urinary retention, likely a chronic problem since she is on flomax BID at baseline.  Would try to minimize anticholinergic medications.  If she did have some dysuria from Enterococcus UTI, this may have exacerbated this problem.  She had a foley catheter placed twice for urinary retention, but by the time of discharge, she was able to void spontaneously.    Hypothyroidism, stable, continued synthroid  Essential hypertension, stable to low blood pressures, discontinued metoprolol.  Underweight, met with nutritionist who recommended supplements and liberalized diet.  She needs encouragement for meals but can feed herself.   Procedures and diagnostic studies:   Dg Chest 2 View 06/05/2015 Persistent left base retrocardiac atelectasis or infiltrate. No pulmonary edema. Borderline cardiomegaly.   Medical Consultants:  None   Other Consultants:  SLP PT  IAnti-Infectives:   Azithromycin 06/05/2015 -->  Rocephin 06/05/2015 --> 06/08/2015 Unasyn 06/08/2015 -->       Discharge Exam: Filed Vitals:   06/12/15 2258 06/13/15 0502  BP: 131/82 114/65  Pulse: 88 62  Temp: 97.7 F (36.5 C) 98.5 F (36.9 C)  Resp: 16 16   Filed Vitals:   06/12/15 1141 06/12/15 1329 06/12/15 2258 06/13/15 0502  BP: 136/70 94/57 131/82 114/65  Pulse: 74 76 88 62  Temp:  98.2 F (36.8 C) 97.7 F (36.5 C) 98.5 F (36.9 C)  TempSrc:  Axillary Axillary Oral  Resp:  18 16 16   Height:      Weight:      SpO2: 96% 96% 95% 99%    General: Frail female, NAD, eyes remained closed but answered "good morning" to me Cardiovascular: RRR, no mrg, < 2 sec CR Respiratory:  Diminished at the bilateral  bases, but no focal rales, rhonchi or wheezes heard anteriorly or in axilla. ABD:  NABS, soft mildly distended, nontender MSK:  Decreased tone and bulk, no LEE  Discharge Instructions      Discharge Instructions    Call MD for:  difficulty breathing, headache or visual disturbances    Complete by:  As directed      Call MD for:  extreme fatigue    Complete by:  As directed      Call MD for:  hives    Complete by:  As directed      Call MD for:  persistant dizziness or light-headedness    Complete by:  As directed      Call MD for:  persistant nausea and vomiting    Complete by:  As directed      Call MD for:  severe uncontrolled pain    Complete by:  As directed      Call MD for:  temperature >100.4    Complete by:  As directed      Diet general    Complete by:  As directed      Increase activity slowly    Complete by:  As directed             Medication List    STOP taking  these medications        ALPRAZolam 0.25 MG tablet  Commonly known as:  XANAX     azithromycin 250 MG tablet  Commonly known as:  ZITHROMAX     cholecalciferol 1000 units tablet  Commonly known as:  VITAMIN D     cyanocobalamin 1000 MCG tablet     ezetimibe 10 MG tablet  Commonly known as:  ZETIA     hydrochlorothiazide 12.5 MG capsule  Commonly known as:  MICROZIDE     magnesium hydroxide 400 MG/5ML suspension  Commonly known as:  MILK OF MAGNESIA     risperiDONE 1 MG/ML oral solution  Commonly known as:  RISPERDAL  Replaced by:  risperiDONE 1 MG tablet     sertraline 50 MG tablet  Commonly known as:  ZOLOFT     traZODone 50 MG tablet  Commonly known as:  DESYREL      TAKE these medications        acetaminophen 500 MG tablet  Commonly known as:  TYLENOL  Take 500 mg by mouth every 4 (four) hours as needed for moderate pain.     alum & mag hydroxide-simeth 200-200-20 MG/5ML suspension  Commonly known as:  MAALOX/MYLANTA  Take 30 mLs by mouth every 6 (six) hours as needed  for indigestion or heartburn. Reported on 04/27/2015     amoxicillin-clavulanate 875-125 MG tablet  Commonly known as:  AUGMENTIN  Take 1 tablet by mouth 2 (two) times daily.     aspirin 81 MG chewable tablet  Chew 81 mg by mouth daily.     divalproex 125 MG capsule  Commonly known as:  DEPAKOTE SPRINKLE  Take 250 mg by mouth 3 (three) times daily with meals.     docusate sodium 100 MG capsule  Commonly known as:  COLACE  Take 100 mg by mouth 2 (two) times daily.     escitalopram 5 MG tablet  Commonly known as:  LEXAPRO  Take 5 mg by mouth daily.     guaifenesin 100 MG/5ML syrup  Commonly known as:  ROBITUSSIN  Take 200 mg by mouth every 6 (six) hours as needed for cough.     hydrOXYzine 25 MG tablet  Commonly known as:  ATARAX/VISTARIL  Take 25 mg by mouth every 4 (four) hours as needed for anxiety.     levothyroxine 50 MCG tablet  Commonly known as:  SYNTHROID, LEVOTHROID  Take 50 mcg by mouth daily before breakfast.     loperamide 2 MG tablet  Commonly known as:  IMODIUM A-D  Take 2 mg by mouth 4 (four) times daily as needed for diarrhea or loose stools.     Melatonin 3 MG Tabs  Take 9 mg by mouth at bedtime.     metoprolol tartrate 25 MG tablet  Commonly known as:  LOPRESSOR  Take 12.5 mg by mouth 2 (two) times daily.     mirtazapine 7.5 MG tablet  Commonly known as:  REMERON  Take 7.5 mg by mouth at bedtime.     neomycin-bacitracin-polymyxin [phone] ointment  Apply 1 application topically daily as needed (skin tears and abrasions.). Apply after cleaning with normal saline, cover with band-aid or gauze and secure with tape. Changed until healed.     NUTRITIONAL DRINK PO  Take 1 each by mouth 3 (three) times daily. Mighty shakes     polyethylene glycol packet  Commonly known as:  MIRALAX / GLYCOLAX  Take 17 g by mouth daily.     QUEtiapine  25 MG tablet  Commonly known as:  SEROQUEL  Take 12.5 mg by mouth every 12 (twelve) hours.     risperiDONE 1 MG  tablet  Commonly known as:  RISPERDAL  Take 1 tablet (1 mg total) by mouth at bedtime.     rivastigmine 4.6 mg/24hr  Commonly known as:  EXELON  Place 4.6 mg onto the skin daily.     saccharomyces boulardii 250 MG capsule  Commonly known as:  FLORASTOR  Take 1 capsule (250 mg total) by mouth 2 (two) times daily.     tamsulosin 0.4 MG Caps capsule  Commonly known as:  FLOMAX  Take 0.4 mg by mouth 2 (two) times daily.     THERA Tabs  Take 1 tablet by mouth every morning.       Follow-up Information    Follow up with GRISSO,GREG, MD. Schedule an appointment as soon as possible for a visit in 1 week.   Specialty:  Internal Medicine   Contact information:   [place] [address] [phone]       Follow up with Lenor Coffin, MD. Schedule an appointment as soon as possible for a visit in 1 month.   Specialty:  Neurology   Contact information:   [address] [place] [address] [phone]       Follow up with [facility] .   Specialty:  [place] information:   [place] [phone]       The results of significant diagnostics from this hospitalization (including imaging, microbiology, ancillary and laboratory) are listed below for reference.    Significant Diagnostic Studies: Dg Chest 2 View  06/05/2015  CLINICAL DATA:  Altered mental status EXAM: CHEST  2 VIEW COMPARISON:  03/20/2015 FINDINGS: Cardiomegaly is noted. Mild elevation of the right hemidiaphragm. Persistent left base retrocardiac atelectasis or infiltrate. No pulmonary edema. There is thoracic spine osteopenia. IMPRESSION: Persistent left base retrocardiac atelectasis or infiltrate. No pulmonary edema. Borderline cardiomegaly. Electronically Signed   By: Lahoma Crocker M.D.   On: 06/05/2015 15:43   Ct Head Wo Contrast  06/11/2015  CLINICAL DATA:  Confusion and lethargy. EXAM: CT HEAD  WITHOUT CONTRAST TECHNIQUE: Contiguous axial images were obtained from the base of the skull through the vertex without intravenous contrast. COMPARISON:  04/27/2015 FINDINGS: The ventricles are normal in configuration. There is ventricular and sulcal enlargement reflecting moderate atrophy. There are no parenchymal masses or mass effect. There is no evidence of a recent cortical infarct. Patchy areas of white matter hypoattenuation noted consistent with mild to moderate chronic microvascular ischemic change. There are no extra-axial masses or abnormal fluid collections. There is no intracranial hemorrhage. Mild right ethmoid sinus mucosal thickening. Small mucous retention cyst in the left sphenoid sinus. Remaining sinuses clear as are the mastoid air cells. IMPRESSION: 1. No acute intracranial abnormalities. 2. Moderate atrophy and mild to moderate chronic microvascular ischemic change, stable from the prior CT. Electronically Signed   By: Lajean Manes M.D.   On: 06/11/2015 14:42    Microbiology: Recent Results (from the past 240 hour(s))  Urine culture     Status: None (Preliminary result)   Collection Time: 06/05/15  3:58 PM  Result Value Ref Range Status   Specimen Description URINE, CATHETERIZED  Final   Special Requests NONE  Final   Culture   Final    >=100,000 COLONIES/mL ENTEROCOCCUS SPECIES A SECOND MORPHOLOGICALLY  DIFFERENT ENTEROCOCCUS SPECIES PRESENT BUT UNABLE TO OBTAIN SENSITIVITIES SENDING TO REFERENCE LAB IN CASE SENSITIVITIES DIFFERENT FROM ORGANISM 1 Performed at [facility]    Report Status PENDING  Incomplete   Organism ID, Bacteria ENTEROCOCCUS SPECIES  Final      Susceptibility   Enterococcus species - MIC*    AMPICILLIN <=2 SENSITIVE Sensitive     LEVOFLOXACIN 0.5 SENSITIVE Sensitive     NITROFURANTOIN <=16 SENSITIVE Sensitive     VANCOMYCIN 1 SENSITIVE Sensitive     * >=100,000 COLONIES/mL ENTEROCOCCUS SPECIES  Culture, blood (Routine X 2) w Reflex to ID  Panel     Status: None   Collection Time: 06/05/15  8:33 PM  Result Value Ref Range Status   Specimen Description BLOOD BLOOD RIGHT HAND  Final   Special Requests IN PEDIATRIC BOTTLE [place]  Final   Culture   Final    NO GROWTH 5 DAYS Performed at [facility]    Report Status 06/10/2015 FINAL  Final  Culture, blood (Routine X 2) w Reflex to ID Panel     Status: None   Collection Time: 06/05/15  8:36 PM  Result Value Ref Range Status   Specimen Description BLOOD LEFT ANTECUBITAL  Final   Special Requests BOTTLES DRAWN AEROBIC AND ANAEROBIC 5CC  Final   Culture   Final    NO GROWTH 5 DAYS Performed at [facility]    Report Status 06/10/2015 FINAL  Final  MRSA PCR Screening     Status: None   Collection Time: 06/06/15  1:17 AM  Result Value Ref Range Status   MRSA by PCR NEGATIVE NEGATIVE Final    Comment:        The GeneXpert MRSA Assay (FDA approved for NASAL specimens only), is one component of a comprehensive MRSA colonization surveillance program. It is not intended to diagnose MRSA infection nor to guide or monitor treatment for MRSA infections.      Labs: Basic Metabolic Panel:  Recent Labs Lab 06/10/15 0534 06/11/15 0511  NA 136 139  K 4.1 4.2  CL 102 101  CO2 27 29  GLUCOSE 98 103*  BUN 22* 17  CREATININE 0.78 0.74  CALCIUM 8.8* 9.0   Liver Function Tests: No results for input(s): AST, ALT, ALKPHOS, BILITOT, PROT, ALBUMIN in the last 168 hours. No results for input(s): LIPASE, AMYLASE in the last 168 hours. No results for input(s): AMMONIA in the last 168 hours. CBC:  Recent Labs Lab 06/10/15 0534 06/11/15 0511  WBC 5.8 4.7  HGB 12.8 12.5  HCT 39.1 39.3  MCV 93.1 94.5  PLT 240 239   Cardiac Enzymes: No results for input(s): CKTOTAL, CKMB, CKMBINDEX, TROPONINI in the last 168 hours. BNP: BNP (last 3 results) No results for input(s): BNP in the last 8760 hours.  ProBNP (last 3 results) No results for input(s): PROBNP in the  last 8760 hours.  CBG: No results for input(s): GLUCAP in the last 168 hours.  Time coordinating discharge: 35 minutes  Signed:  Elery Cadenhead  [facility] 06/13/2015, 9:17 AM

## 2015-06-12 NOTE — Progress Notes (Signed)
[facility] PROGRESS NOTE  Janet Knight [id number] DOB: 1933/11/20 DOA: 06/05/2015 PCP: Gilford Rile, MD  Brief Summary  82 year old female with history of dementia, hypertension, hypothyroidism who presented to the emergency department with altered mental status, aggressive behaviors. Chest x-ray demonstrated pneumonia. She has had poor level of alertness and decreased oral intake since admission.  She has been lying in the fetal position and refusing to work with physical and occupational therapy and does not speak or communicate.  Unfortunately, she is too frail and requires too much assistance for ADLs to safely go back to her ALF.  Her family does not have enough money to pay for 24h aid.  Because her family members work and cannot afford to quit their jobs, she cannot be taken home with family and again they cannot afford a 24 hour aid.  She is not qualifying for acute rehab at SNF because her illness and dementia limit her ability to participate in PT/OT.  She would be custodial care at a SNF and the social worker has been having problems with the insurance company getting coverage for placement for this indication.  Because she has continued to eat well and has had stable to improved weight, despite her pneumonia and current condition, hospice is unlikely to accept her to residential hospice and she may not even qualify for hospice care altogether.    Assessment/Plan  Acute respiratory failure with hypoxia secondary to left lower lobe pneumonia, likely CAP vs. Aspiration. She was initially placed on ceftriaxone and azithromycin, however, her antibiotics were change to unasyn and azithromycin for aspiration. She completed more than 1.5gm of azithromycin and will complete her unasyn course on 3/12.  Influenza was negative. Blood cultures are so far no growth to date. She was seen by speech therapy who recommended a regular diet with thin liquids and aspiration precautions. She is  now stable on room air.   -  Eating a little better today (had been only tolerating a few bites with encouragement yesterday -  D/c IVF  Alzheimer's dementia with superimposed acute metabolic encephalopathy due to pneumonia and UTI. Also has underlying depression.  -  Continue risperidone dosed nightly -  I have discontinued her PRN sedating medications for now -  CT head negative for large stroke or acute change -  Doubt seizure.   -  I think her dementia is progressing and I have discussed this with her son.  We also discussed overall GOC, including code status.  I have recommended DNR/DNI and he is going to consider this option.  I have also placed a palliative care consultation.   -  Declined by residential hospice as she is still eating and drinking some  Possible Enterococcus UTI, based on urine culture obtained at the time of the admission. UA only demonstrated a few bacteria and 6-30 WBC, so I suspect this may have been colonization, but her antibiotics for pneumonia will cover this also. Started unasyn on 3/6.  Essential hypertension, stable, continued metoprolol 12.5 mg POP BID  Hypothyroidism, stable, continued synthroid  Acute urinary retention -  Continue home BID flomax -  D/c foley this morning and voiding trial -  If she fails voiding trial, will recommending indwelling foley and close follow up with urology  Diet:  regular Access:  PIV IVF:  off Proph:  lovenox  Code Status: full code Family Communication: patient and her son Disposition Plan:  Awaiting PASSR and insurance approval for possible SNF.  SNF chosen by family may  be closed down due to Flu, so may need to find alternative facility   Consultants:  Palliative care consultation  Procedures:  CT head  Antibiotics: Azithromycin 06/05/2015 --> 3/9 Rocephin 06/05/2015 --> 06/08/2015 Unasyn 06/08/2015 -->   HPI/Subjective:  Unable to obtain, patient confused and demented.    Objective: Filed  Vitals:   06/11/15 1358 06/11/15 2101 06/12/15 0453 06/12/15 1141  BP: 151/81 146/92 125/72 136/70  Pulse: 57 68 54 74  Temp: 97.5 F (36.4 C) 98.4 F (36.9 C) 97.9 F (36.6 C)   TempSrc: Axillary Oral Axillary   Resp: 16 18 20    Height:      Weight:      SpO2: 93% 97% 97% 96%    Intake/Output Summary (Last 24 hours) at 06/12/15 1327 Last data filed at 06/12/15 1122  Gross per 24 hour  Intake      0 ml  Output   1025 ml  Net  -1025 ml   Filed Weights   06/05/15 2058  Weight: 49.896 kg (110 lb)   Body mass index is 18.31 kg/(m^2).  Exam:   General:  Frail/cachectic appearing female, awake, talking gibberish  HEENT:  NCAT, MMM  Cardiovascular:  RRR, nl S1, S2 no mrg, 2+ pulses, warm extremities  Respiratory:  CTAB, no increased WOB  Abdomen:   NABS, soft, mildly distended  MSK:   Decreased tone and bulk, no LEE  Neuro:  Grossly moves all extremities.    Foley catheter still in place during rounds this morning  Data Reviewed: Basic Metabolic Panel:  Recent Labs Lab 06/05/15 1555 06/06/15 0620 06/10/15 0534 06/11/15 0511  NA 141 135 136 139  K 4.9 4.2 4.1 4.2  CL 103 102 102 101  CO2 29 24 27 29   GLUCOSE 125* 101* 98 103*  BUN 28* 13 22* 17  CREATININE 0.95 0.77 0.78 0.74  CALCIUM 9.5 8.4* 8.8* 9.0   Liver Function Tests:  Recent Labs Lab 06/05/15 1555 06/06/15 0620  AST 27 26  ALT 16 15  ALKPHOS 76 68  BILITOT 0.4 0.2*  PROT 7.1 6.6  ALBUMIN 3.6 3.3*   No results for input(s): LIPASE, AMYLASE in the last 168 hours. No results for input(s): AMMONIA in the last 168 hours. CBC:  Recent Labs Lab 06/05/15 1555 06/06/15 0620 06/10/15 0534 06/11/15 0511  WBC 6.6 5.0 5.8 4.7  NEUTROABS 4.4 2.8  --   --   HGB 12.8 13.0 12.8 12.5  HCT 40.3 40.4 39.1 39.3  MCV 95.5 95.3 93.1 94.5  PLT 308 301 240 239    Recent Results (from the past 240 hour(s))  Urine culture     Status: None (Preliminary result)   Collection Time: 06/05/15  3:58 PM   Result Value Ref Range Status   Specimen Description URINE, CATHETERIZED  Final   Special Requests NONE  Final   Culture   Final    >=100,000 COLONIES/mL ENTEROCOCCUS SPECIES A SECOND MORPHOLOGICALLY DIFFERENT ENTEROCOCCUS SPECIES PRESENT BUT UNABLE TO OBTAIN SENSITIVITIES SENDING TO REFERENCE LAB IN CASE SENSITIVITIES DIFFERENT FROM ORGANISM 1 Performed at [facility]    Report Status PENDING  Incomplete   Organism ID, Bacteria ENTEROCOCCUS SPECIES  Final      Susceptibility   Enterococcus species - MIC*    AMPICILLIN <=2 SENSITIVE Sensitive     LEVOFLOXACIN 0.5 SENSITIVE Sensitive     NITROFURANTOIN <=16 SENSITIVE Sensitive     VANCOMYCIN 1 SENSITIVE Sensitive     * >=100,000 COLONIES/mL ENTEROCOCCUS  SPECIES  Culture, blood (Routine X 2) w Reflex to ID Panel     Status: None   Collection Time: 06/05/15  8:33 PM  Result Value Ref Range Status   Specimen Description BLOOD BLOOD RIGHT HAND  Final   Special Requests IN PEDIATRIC BOTTLE [place]  Final   Culture   Final    NO GROWTH 5 DAYS Performed at [facility]    Report Status 06/10/2015 FINAL  Final  Culture, blood (Routine X 2) w Reflex to ID Panel     Status: None   Collection Time: 06/05/15  8:36 PM  Result Value Ref Range Status   Specimen Description BLOOD LEFT ANTECUBITAL  Final   Special Requests BOTTLES DRAWN AEROBIC AND ANAEROBIC 5CC  Final   Culture   Final    NO GROWTH 5 DAYS Performed at [facility]    Report Status 06/10/2015 FINAL  Final  MRSA PCR Screening     Status: None   Collection Time: 06/06/15  1:17 AM  Result Value Ref Range Status   MRSA by PCR NEGATIVE NEGATIVE Final    Comment:        The GeneXpert MRSA Assay (FDA approved for NASAL specimens only), is one component of a comprehensive MRSA colonization surveillance program. It is not intended to diagnose MRSA infection nor to guide or monitor treatment for MRSA infections.      Studies: Ct Head Wo  Contrast  06/11/2015  CLINICAL DATA:  Confusion and lethargy. EXAM: CT HEAD WITHOUT CONTRAST TECHNIQUE: Contiguous axial images were obtained from the base of the skull through the vertex without intravenous contrast. COMPARISON:  04/27/2015 FINDINGS: The ventricles are normal in configuration. There is ventricular and sulcal enlargement reflecting moderate atrophy. There are no parenchymal masses or mass effect. There is no evidence of a recent cortical infarct. Patchy areas of white matter hypoattenuation noted consistent with mild to moderate chronic microvascular ischemic change. There are no extra-axial masses or abnormal fluid collections. There is no intracranial hemorrhage. Mild right ethmoid sinus mucosal thickening. Small mucous retention cyst in the left sphenoid sinus. Remaining sinuses clear as are the mastoid air cells. IMPRESSION: 1. No acute intracranial abnormalities. 2. Moderate atrophy and mild to moderate chronic microvascular ischemic change, stable from the prior CT. Electronically Signed   By: Lajean Manes M.D.   On: 06/11/2015 14:42    Scheduled Meds: . ampicillin-sulbactam (UNASYN) IV  3 g Intravenous Q6H  . aspirin  81 mg Oral Daily  . divalproex  250 mg Oral TID WC  . enoxaparin (LOVENOX) injection  40 mg Subcutaneous QHS  . escitalopram  5 mg Oral Daily  . feeding supplement (ENSURE ENLIVE)  237 mL Oral BID BM  . levothyroxine  50 mcg Oral QAC breakfast  . metoprolol tartrate  12.5 mg Oral BID  . mirtazapine  7.5 mg Oral QHS  . QUEtiapine  12.5 mg Oral Q12H  . risperiDONE  1 mg Oral QHS  . tamsulosin  0.4 mg Oral BID   Continuous Infusions:   Principal Problem:   Acute respiratory failure with hypoxia (HCC) Active Problems:   Essential hypertension   Alzheimer's disease   Acute encephalopathy   Enterococcus UTI   Lobar pneumonia, unspecified organism ([place])   Aspiration pneumonia ([place])    Time spent: 30 min    Tanvi Gatling, [place] [facility] Pager  [phone]. If 7PM-7AM, please contact night-coverage at [email or web address], password [facility] 06/12/2015, 1:27 PM  LOS: 7 days

## 2015-06-12 NOTE — Progress Notes (Signed)
Date:  June 12, 2015 Chart reviewed for concurrent status and case management needs. Will continue to follow patient for changes and needs: Velva Harman, BSN, [place], [place]   [phone]

## 2015-06-13 MED ORDER — RISPERIDONE 1 MG PO TABS: 1.0000 mg | ORAL_TABLET | Freq: Every day | ORAL | Status: AC

## 2015-06-13 NOTE — Progress Notes (Addendum)
Patient was scheduled for d/c to SNF yesterday evening but d/c was delayed due to patient's inability to void per nursing. Discussed with Dr. Sheran Fava today. She indicated that this issue has resolved and thus patient is stable for d/c to [facility] today with Palliative care.  Lorie Phenix. [place], [place] (weekend coverage)

## 2015-06-13 NOTE — Clinical Social Work Placement (Signed)
   CLINICAL SOCIAL WORK PLACEMENT  NOTE  Date:  06/13/2015  Patient Details  Name: Janet Knight MRN: [id number] Date of Birth: 1933/09/06  Clinical Social Work is seeking post-discharge placement for this patient at the [place] level of care (*CSW will initial, date and re-position this form in  chart as items are completed):  Yes   Patient/family provided with [place] Work Department's list of facilities offering this level of care within the geographic area requested by the patient (or if unable, by the patient's family).  Yes   Patient/family informed of their freedom to choose among providers that offer the needed level of care, that participate in Medicare, Medicaid or managed care program needed by the patient, have an available bed and are willing to accept the patient.  Yes   Patient/family informed of [place] [facility]'s ownership interest in [facility] and [facility], as well as of the fact that they are under no obligation to receive care at these facilities.  PASRR submitted to EDS on       PASRR number received on       Existing PASRR number confirmed on       FL2 transmitted to all facilities in geographic area requested by pt/family on 06/08/15     FL2 transmitted to all facilities within larger geographic area on       Patient informed that his/her managed care company has contracts with or will negotiate with certain facilities, including the following:        Yes   Patient/family informed of bed offers received.  Patient chooses bed at [facility] and [place] recommends and patient chooses bed at      Patient to be transferred to [facility] on 06/13/15.  Patient to be transferred to facility by Ambulance [facility])     Patient family notified on 06/13/15 of transfer.  Name of family member notified:  Tedra Senegal     PHYSICIAN Please sign FL2     Additional Comment: OK per MD  for d/c today to [facility] for SNF care and Palliative follow up.  Nursing notified to call report and DC summary sent to facility for review.  CSW spoke with Barbaraann Share who was pleased with d/c plan. He wanted to make sure facility knew to "crush" her meds.  Notified RN to follow up with this in her rerpot to SNF.  Patient is alert but disoriented X4.  No further CSW needs identified. CSW signing off.   _______________________________________________ Williemae Area, LCSW 06/13/2015, 9:41 AM  [phone]

## 2015-06-13 NOTE — Progress Notes (Signed)
Report called to Ladean Raya, RN at [facility]. Patient waiting on [facility] for transportation.

## 2015-06-15 ENCOUNTER — Telehealth: Payer: Self-pay | Admitting: Neurology

## 2015-06-15 NOTE — Telephone Encounter (Signed)
I returned [name]'s phone call from [facility], but she has gone for the day, and no one else can help me. Will try again tomorrow.

## 2015-06-15 NOTE — Telephone Encounter (Signed)
Janet N./[facility] [phone] called to schedule appt with Dr Jannifer Franklin. Pt has been discharged from [facility] 06/13/15 and needs 60mth f/u for dementia. Dr Jannifer Franklin is booked until July. She is aware Dr Jannifer Franklin is out of the office until 3/15 and this can wait until his return.

## 2015-06-16 LAB — URINE CULTURE: Culture: >=100000

## 2015-06-16 NOTE — Telephone Encounter (Signed)
I called Tedra Senegal, pt's son, per DPR, to advise him of the appt made for the pt on 4/25 at 12:00 with Dr. Jannifer Franklin, per request from [facility]. No answer, left a message asking him to call me back.  If pt's son calls back, please advise him that [facility] requested a follow up with Dr. Jannifer Franklin since the pt was recently discharged from the hospital and the appt was made for 4/25 at 12:00.

## 2015-06-16 NOTE — Telephone Encounter (Signed)
I spoke to [place] at [facility] and made an appt for pt for 07/28/15 at 12:00. This is an overbook appt. Holly verbalized understanding of appt.

## 2015-06-17 NOTE — Telephone Encounter (Signed)
Spoke to pt's son. Pt's son reports that pt was hospitalized 1.5 weeks ago for pneumonia. Before her hospitalization, she was able to walk and get around. Now, she is unable to walk, she just rambles and mutters, and leans to the left. A CT head was performed in the hospital to rule out stroke, but there is still no explanation for this dramatic decline. Pt is concerned that pt will not physically be able to attend the 4/25 appt with Dr. Jannifer Franklin.  Pt's son is requesting a call from Dr. Jannifer Franklin to discuss pt's decline and current state. He can be reached at [phone] and prefers a call in the evening hours.

## 2015-06-17 NOTE — Telephone Encounter (Signed)
I called, left message. The patient has had a pneumonia, recent hospitalization. The current low functional level could be related to a metabolic disturbance set up from this hospitalization, it may take several weeks to improve. The patient is also back on Risperdal which previously caused a secondary parkinsonism syndrome. It is still possible she may have had a stroke. I will be happy take a look at her in the office, or tell what is going on with her over the telephone.

## 2015-07-28 ENCOUNTER — Ambulatory Visit: Payer: Self-pay | Admitting: Neurology

## 2015-08-05 ENCOUNTER — Ambulatory Visit: Payer: Self-pay | Admitting: Neurology

## 2015-08-13 ENCOUNTER — Ambulatory Visit: Payer: Self-pay | Admitting: Neurology

## 2015-08-17 ENCOUNTER — Ambulatory Visit: Payer: Medicare HMO | Admitting: Neurology

## 2015-09-08 ENCOUNTER — Ambulatory Visit (INDEPENDENT_AMBULATORY_CARE_PROVIDER_SITE_OTHER): Payer: Medicare HMO | Admitting: Neurology

## 2015-09-08 ENCOUNTER — Telehealth: Payer: Self-pay | Admitting: Neurology

## 2015-09-08 ENCOUNTER — Encounter: Payer: Self-pay | Admitting: Neurology

## 2015-09-08 VITALS — BP 116/80 | HR 82 | Resp 20

## 2015-09-08 DIAGNOSIS — F0391 Unspecified dementia with behavioral disturbance: Secondary | ICD-10-CM | POA: Diagnosis not present

## 2015-09-08 DIAGNOSIS — G308 Other Alzheimer's disease: Secondary | ICD-10-CM

## 2015-09-08 DIAGNOSIS — F03918 Unspecified dementia, unspecified severity, with other behavioral disturbance: Secondary | ICD-10-CM | POA: Insufficient documentation

## 2015-09-08 DIAGNOSIS — F0281 Dementia in other diseases classified elsewhere with behavioral disturbance: Secondary | ICD-10-CM

## 2015-09-08 DIAGNOSIS — G309 Alzheimer's disease, unspecified: Principal | ICD-10-CM

## 2015-09-08 NOTE — Telephone Encounter (Signed)
Pt's son Cecilie Lowers called and would like to speak with Dr. Jannifer Franklin about his mothers appt today. He was unable to attend and would like an update. Please call  [phone] may leave message.

## 2015-09-08 NOTE — Telephone Encounter (Signed)
Returned TC and spoke w/ pt's son. Let him know that pt did come in for appt today. Dr. Jannifer Franklin plans to keep pt on Risperidal despite some Parkinsonism d/t med management of behavior. She may f/u as needed. Cecilie Lowers verbalized understanding, voiced agreement to plan of care, and will call back w/ further questions/concerns.

## 2015-09-08 NOTE — Progress Notes (Signed)
Reason for visit: Dementia  Janet Knight is an 82 y.o. 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 The patient has been treated with Lexapro, Depakote, and Risperdal. The Risperdal has resulted in a secondary parkinsonism syndrome, but use of other agents such as Zyprexa or Abilify were not effective for the severe agitation. The patient is now wheelchair-bound, nonambulatory. She does have some episodes of agitation, but this is not severe. She is sleeping well at night, and eating well according to the caretaker. The patient wears adult diapers, she is unable to communicate when she needs to use the bathroom. Her verbal capacity has significantly declined, most of her speech is nonsensical. The patient has not had any falls, she does not attempt to get up from the wheelchair. She apparently has a significant degree of fatigue, she can sit up during the day for short periods of time, but she spends most of her day in bed.  History reviewed. No pertinent past medical history.  Past Surgical History  Procedure Laterality Date  . Cardiac catheterization  12/03/2003    was found to have normal coronary arteries and normal LV funtion    Family History  Problem Relation Age of Onset  . Heart attack Father 38  . Heart attack Brother   . Heart disease Brother   . Heart failure Mother   . Cancer Sister     liver  . Dementia Neg Hx     Social history:  reports that she has never smoked. She has never used smokeless tobacco. She reports that she does not drink alcohol or use illicit drugs.    Allergies  Allergen Reactions  . Lipitor [Atorvastatin]     myalgias    Medications:  Prior to Admission medications   Medication Sig Start Date End Date Taking? Authorizing Provider  acetaminophen (TYLENOL) 500 MG tablet Take 500 mg by mouth  every 4 (four) hours as needed for moderate pain.   Yes Historical Provider, MD  alum & mag hydroxide-simeth (MAALOX/MYLANTA) 200-200-20 MG/5ML suspension Take 30 mLs by mouth every 6 (six) hours as needed for indigestion or heartburn. Reported on 04/27/2015   Yes Historical Provider, MD  amoxicillin-clavulanate (AUGMENTIN) 875-125 MG tablet Take 1 tablet by mouth 2 (two) times daily. 06/10/15  Yes Janece Canterbury, MD  aspirin 81 MG chewable tablet Chew 81 mg by mouth daily.   Yes Historical Provider, MD  bethanechol (URECHOLINE) 10 MG tablet Take 10 mg by mouth daily. Take 2 tablets in the morning for urinary retention.   Yes Historical Provider, MD  divalproex (DEPAKOTE SPRINKLE) 125 MG capsule Take 250 mg by mouth 3 (three) times daily with meals. 01/29/15 01/29/16 Yes Historical Provider, MD  docusate sodium (COLACE) 100 MG capsule Take 100 mg by mouth 2 (two) times daily.   Yes Historical Provider, MD  escitalopram (LEXAPRO) 10 MG tablet Take 10 mg by mouth daily.   Yes Historical Provider, MD  guaifenesin (ROBITUSSIN) 100 MG/5ML syrup Take 200 mg by mouth every 6 (six) hours as needed for cough.   Yes Historical Provider, MD  hydrOXYzine (ATARAX/VISTARIL) 25 MG tablet Take 25 mg by mouth every 4 (four) hours as needed for anxiety.   Yes Historical Provider, MD  levothyroxine (SYNTHROID, LEVOTHROID) 75 MCG tablet Take 75 mcg by mouth daily before breakfast.   Yes Historical Provider, MD  loperamide (  IMODIUM A-D) 2 MG tablet Take 2 mg by mouth 4 (four) times daily as needed for diarrhea or loose stools.   Yes Historical Provider, MD  Melatonin 3 MG TABS Take 9 mg by mouth at bedtime. 01/29/15  Yes Historical Provider, MD  metoprolol tartrate (LOPRESSOR) 25 MG tablet Take 12.5 mg by mouth 2 (two) times daily.    Yes Historical Provider, MD  mirtazapine (REMERON) 7.5 MG tablet Take 7.5 mg by mouth at bedtime.   Yes Historical Provider, MD  Multiple Vitamin (THERA) TABS Take 1 tablet by mouth every  morning. 01/29/15  Yes Historical Provider, MD  neomycin-bacitracin-polymyxin (NEOSPORIN) [phone] ointment Apply 1 application topically daily as needed (skin tears and abrasions.). Apply after cleaning with normal saline, cover with band-aid or gauze and secure with tape. Changed until healed.   Yes Historical Provider, MD  Nutritional Supplements (NUTRITIONAL DRINK PO) Take 1 each by mouth 3 (three) times daily. Mighty shakes   Yes Historical Provider, MD  polyethylene glycol (MIRALAX / GLYCOLAX) packet Take 17 g by mouth daily.   Yes Historical Provider, MD  QUEtiapine (SEROQUEL) 25 MG tablet Take 12.5 mg by mouth every 12 (twelve) hours.   Yes Historical Provider, MD  risperiDONE (RISPERDAL) 1 MG tablet Take 1 tablet (1 mg total) by mouth at bedtime. 06/13/15  Yes Janece Canterbury, MD  rivastigmine (EXELON) 4.6 mg/24hr Place 4.6 mg onto the skin daily.   Yes Historical Provider, MD  saccharomyces boulardii (FLORASTOR) 250 MG capsule Take 1 capsule (250 mg total) by mouth 2 (two) times daily. 06/10/15  Yes Janece Canterbury, MD  tamsulosin (FLOMAX) 0.4 MG CAPS capsule Take 0.4 mg by mouth 2 (two) times daily. 01/29/15  Yes Historical Provider, MD    ROS:  Out of a complete 14 system review of symptoms, the patient complains only of the following symptoms, and all other reviewed systems are negative.  Agitation, confusion  Blood pressure 116/80, pulse 82, resp. rate 20.  Physical Exam  General: The patient is alert, will not cooperate with verbal commands.  Respiratory: Lung fields are clear.  Cardiovascular: Regular rate and rhythm, no murmurs.  Neck: Neck is supple, no carotid bruits are noted.  Skin: No significant peripheral edema is noted.   Neurologic Exam  Mental status: The patient is alert, oriented 0. Speech is nonsensical. The patient is not able to follow verbal commands.   Cranial nerves: Facial symmetry is present. Speech is nonsensical. Extraocular movements are  full, patient will follow objects visually. Visual fields are full to threat.  Motor: The patient has good strength in all 4 extremities, but increased motor tone with cogwheel rigidity is noted on all fours..  Sensory examination: The patient response to pain stimulation on all 4 extremities.  Coordination: The patient is unable to follow commands for cerebellar testing.  Gait and station: The patient cannot ambulate, attempts to bring the patient to a standing position are unsuccessful.  Reflexes: Deep tendon reflexes are symmetric.   Assessment/Plan:  1. End stage dementia, Alzheimer's disease  2. Parkinsonism  3. Nonambulatory state  The patient has an end-stage dementia. The Risperdal likely is causing some secondary parkinsonism, patient has better controlled behavior on the medication. I will not alter the medication dosing at this time. The patient will follow-up through this office on an as-needed basis.  Jill Alexanders MD 09/08/2015 7:37 PM  [facility] [address] [place], [place] [address]  Phone [phone] Fax [phone]

## 2015-12-04 DEATH — deceased

## 2016-12-08 IMAGING — CT CT HEAD W/O CM
4 of 5 series · 15 of 33 positions shown, 17 images · non-contrast
Comparison: 01/31/2015

CLINICAL DATA: Unwitnessed fall. Dementia. Alzheimer' s disease.
Hypertension.

EXAM:
CT HEAD WITHOUT CONTRAST
CT CERVICAL SPINE WITHOUT CONTRAST
TECHNIQUE: Multidetector CT imaging of the head and cervical spine was
performed following the standard protocol without intravenous
contrast. Multiplanar CT image reconstructions of the cervical spine
were also generated.

[Series 4: headseq 2.4 h60s · axial · 0.43mm/px · z∈[-108,-35]mm · 3 of 60 slices shown, 4 images]
[im 15/60  soft-tissue]
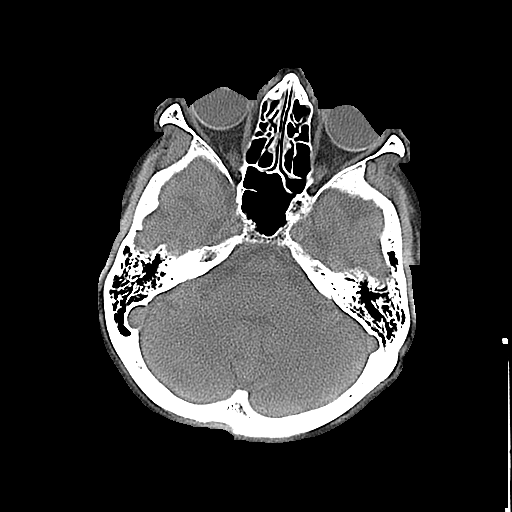
[im 15/60  bone]
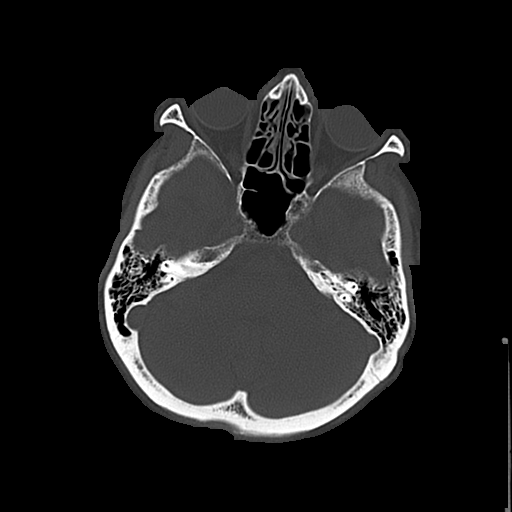
[im 30/60  bone]
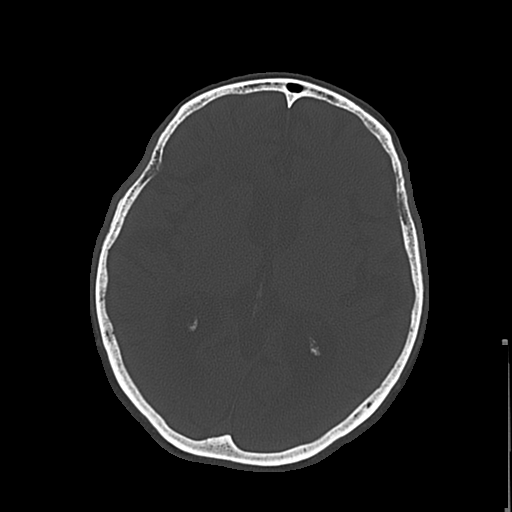
[im 45/60  bone]
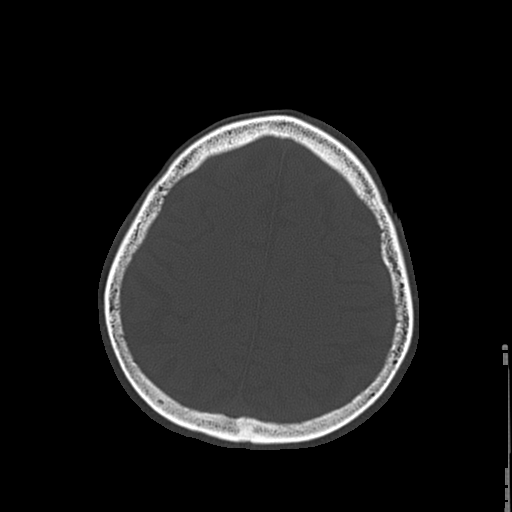

[Series 5: c_spine 2.0 b31s · axial · 0.23mm/px · z∈[-241,-167]mm · 4 of 63 slices shown]
[im 13/63  bone]
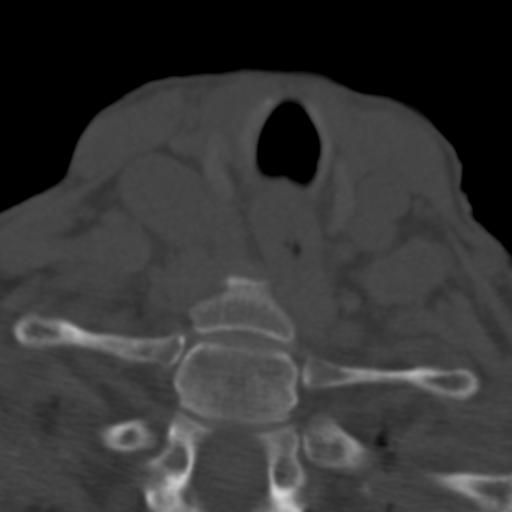
[im 25/63  bone]
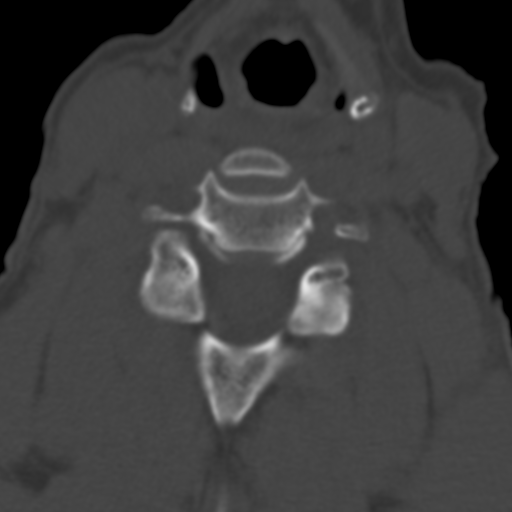
[im 38/63  bone]
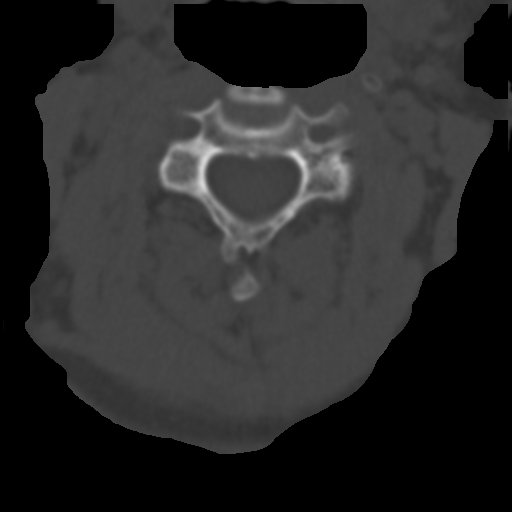
[im 50/63  bone]
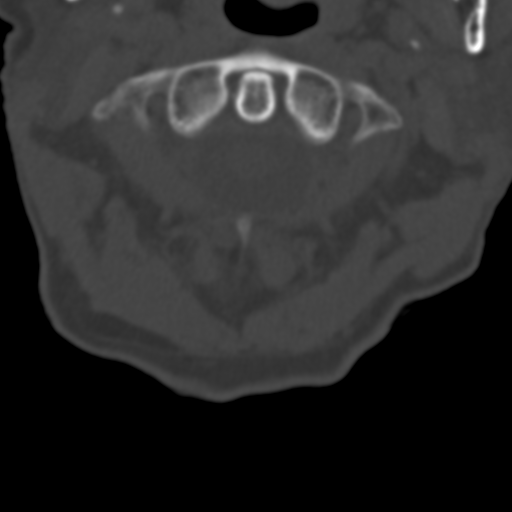

[Series 603: <mpr thick range(1)> · coronal · 0.24mm/px · 3 of 62 slices shown]
[im 13/62  bone]
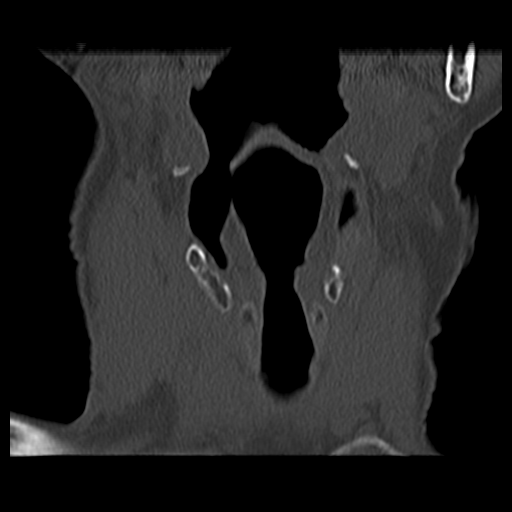
[im 25/62  bone]
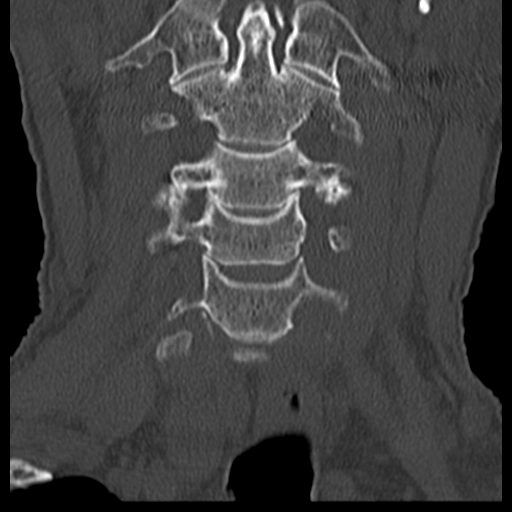
[im 36/62  bone]
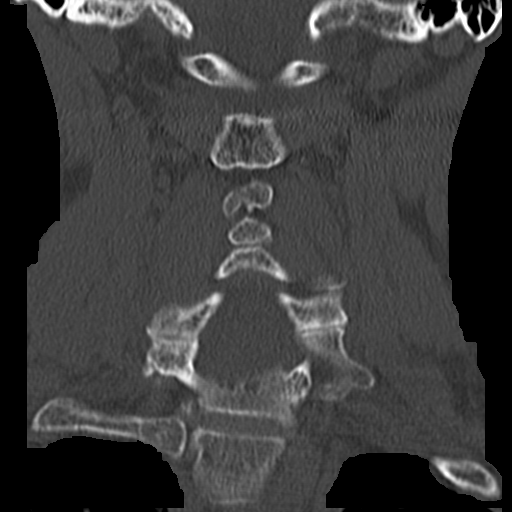

[Series 604: <mpr thick range(2)> · sagittal · 0.24mm/px · 5 of 60 slices shown, 6 images]
[im 20/60  bone]
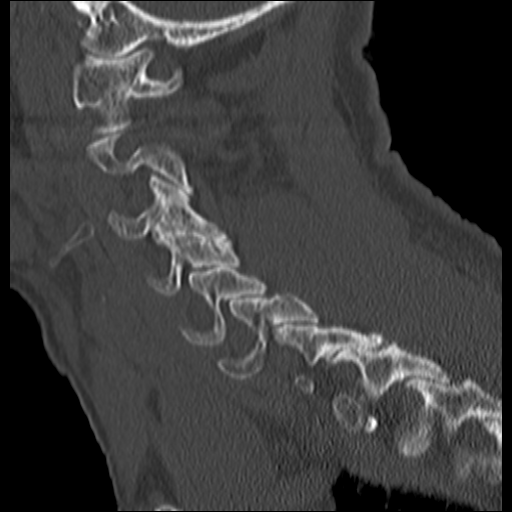
[im 25/60  bone]
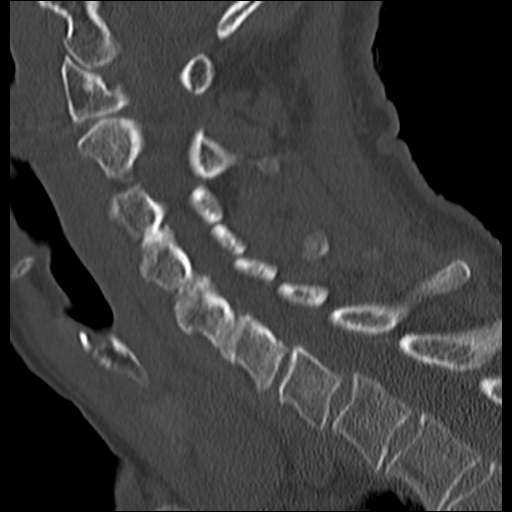
[im 30/60  soft-tissue]
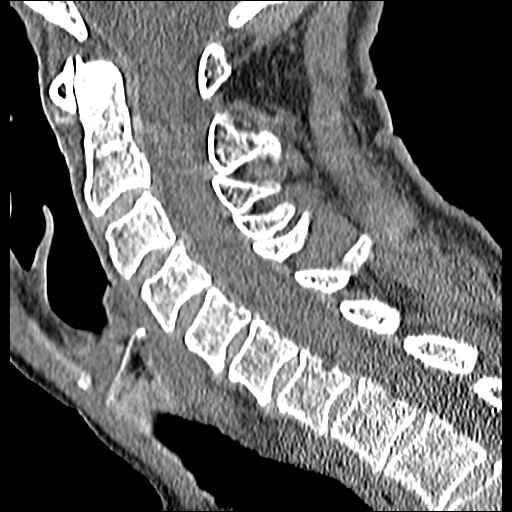
[im 30/60  bone]
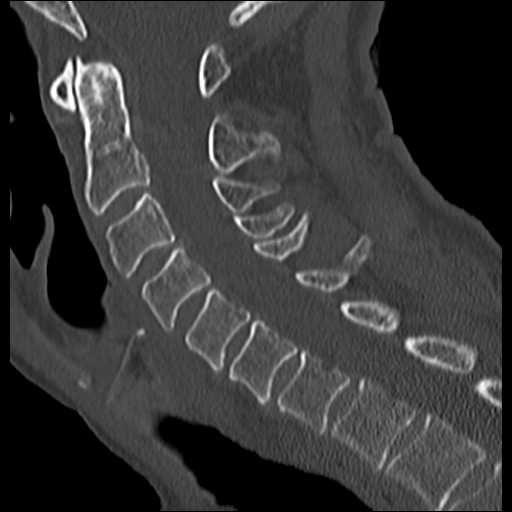
[im 35/60  bone]
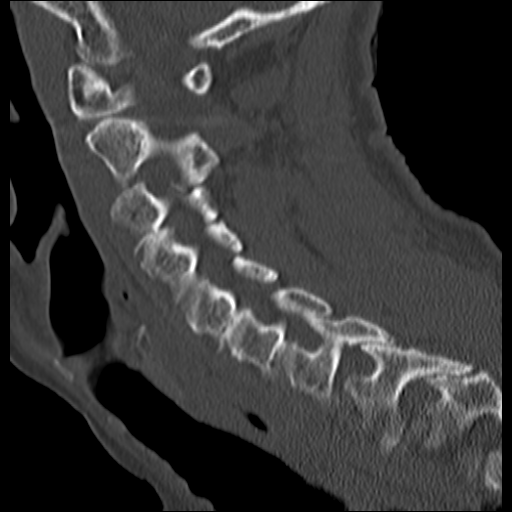
[im 40/60  bone]
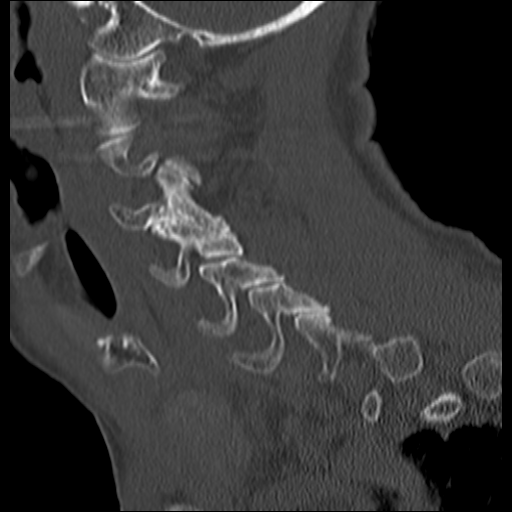

[15 of 33 positions shown; findings below may reference images not displayed]

FINDINGS: CT HEAD FINDINGS

Stable cerebral cavernous malformation in the right temporal lobe,
image 9 series 3, no change from prior MRI 06/11/2007.

Faint calcifications along the globus pallidus nuclei bilaterally.
Ex vacuo ventricular prominence. Periventricular white matter and
corona radiata hypodensities favor chronic ischemic microvascular
white matter disease.

No intracranial hemorrhage, mass lesion, or acute CVA.

CT CERVICAL SPINE FINDINGS

Prominent degenerative facet arthropathy at C3-4. In conjunction
with uncinate spurring this causes some borderline osseous foraminal
stenosis at C3-4.

No vertebral subluxation is observed. No cervical spine fracture is
identified.

There is biapical pleural parenchymal scarring.
IMPRESSION: 1. No acute intracranial findings are acute cervical spine findings.
2. Stable cerebral cavernous vascular malformation anteriorly in the
right temporal lobe, not appreciably changed from 6996.
3. Periventricular white matter and corona radiata hypodensities
favor chronic ischemic microvascular white matter disease.
4. Cervical spondylosis at C3-4 causing borderline osseous foraminal
stenosis bilaterally at this level.

## 2017-01-22 IMAGING — CT CT HEAD W/O CM
1 series · 16 of 30 positions shown, 20 images · non-contrast
Comparison: 04/27/2015

CLINICAL DATA: Confusion and lethargy.

EXAM:
CT HEAD WITHOUT CONTRAST
TECHNIQUE: Contiguous axial images were obtained from the base of the skull
through the vertex without intravenous contrast.

[Series 2: headseq 4.8 h45s · axial · 0.38mm/px · z∈[+1032,+1187]mm · 16 of 36 slices shown, 20 images]
[im 2/36  brain]
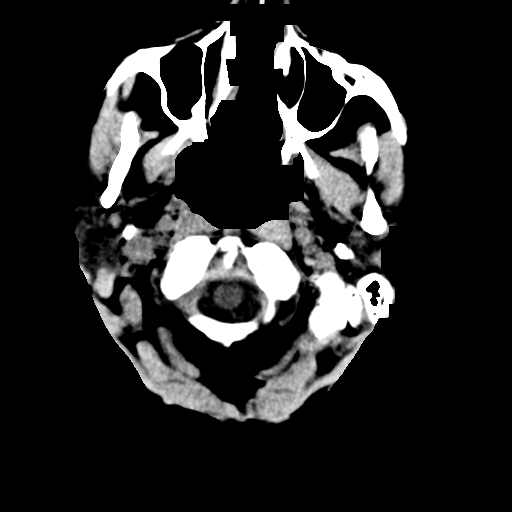
[im 2/36  bone]
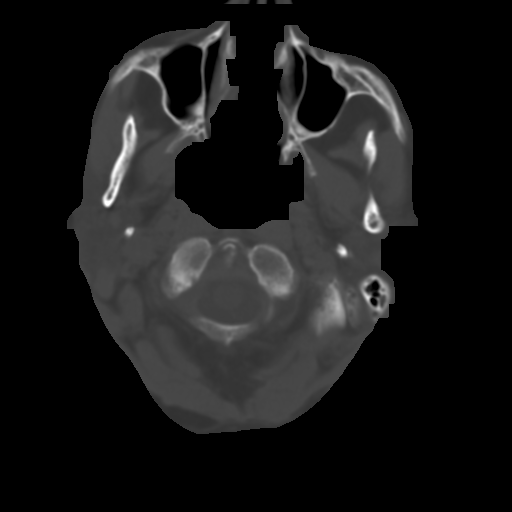
[im 4/36  brain]
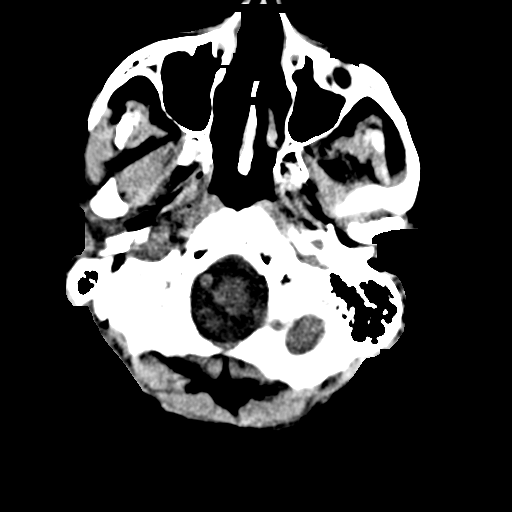
[im 7/36  brain]
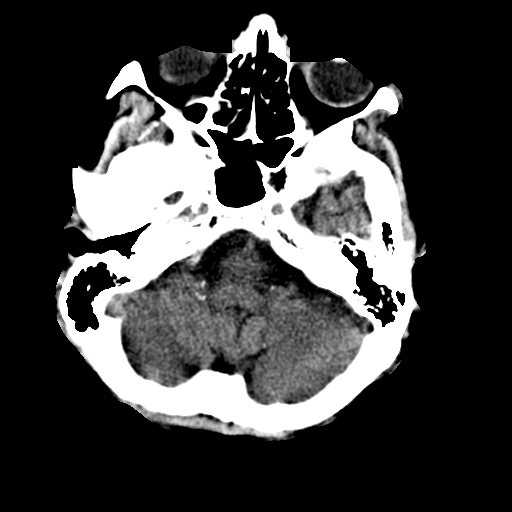
[im 9/36  brain]
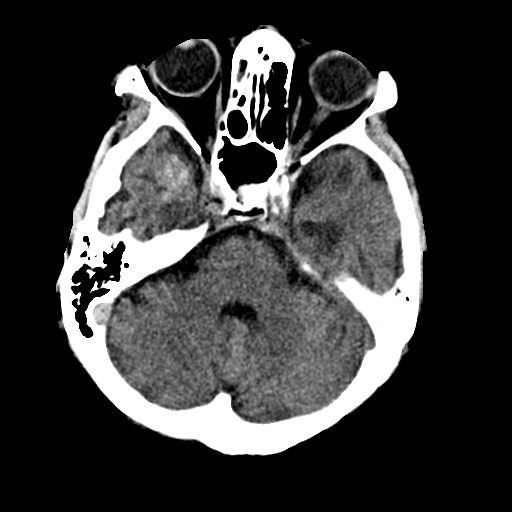
[im 10/36  brain]
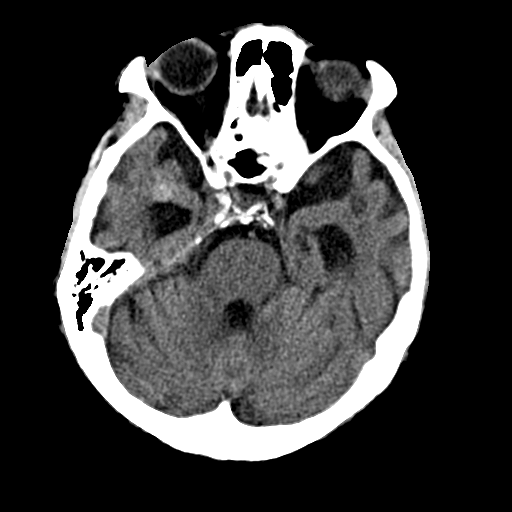
[im 10/36  bone]
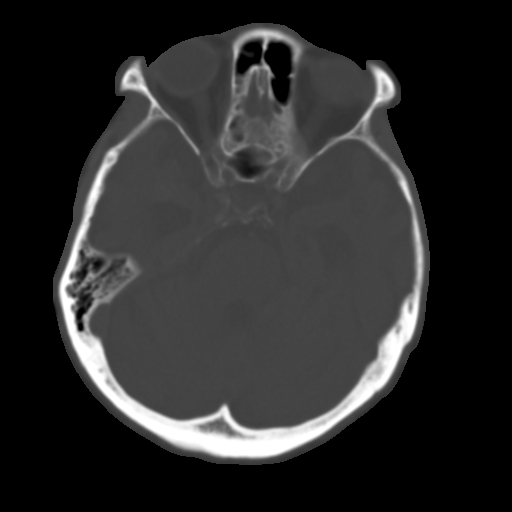
[im 13/36  brain]
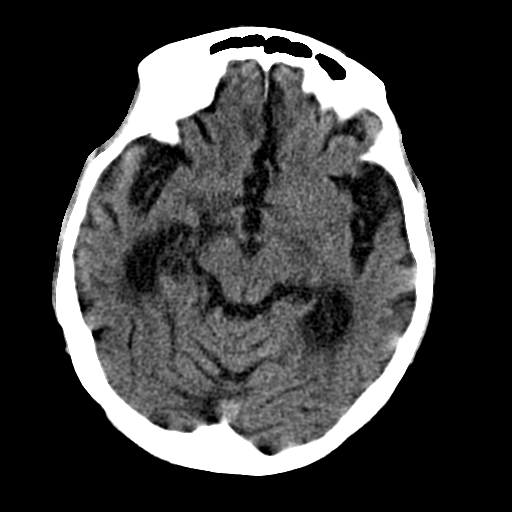
[im 15/36  brain]
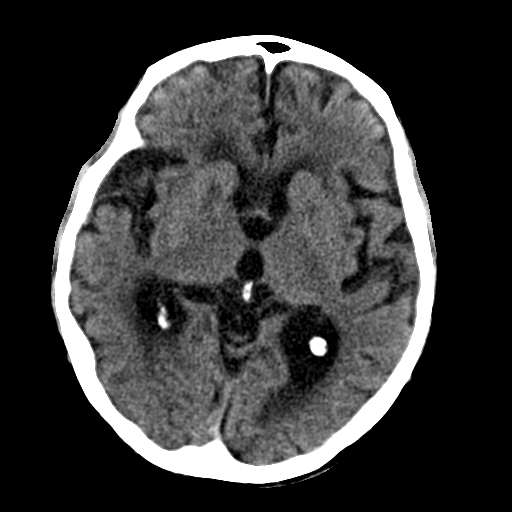
[im 17/36  brain]
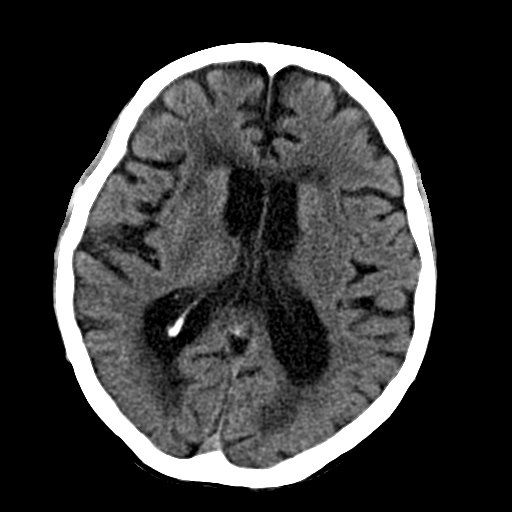
[im 19/36  brain]
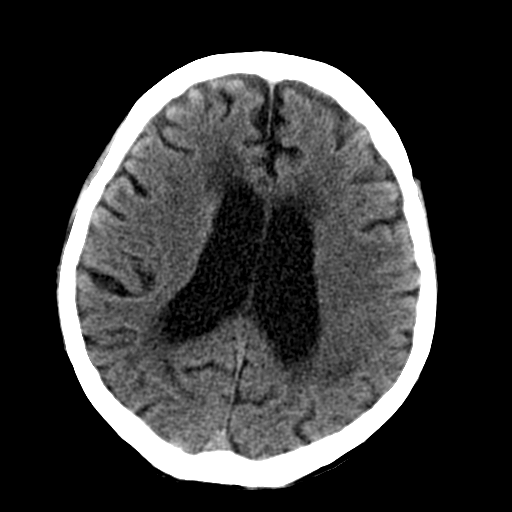
[im 19/36  bone]
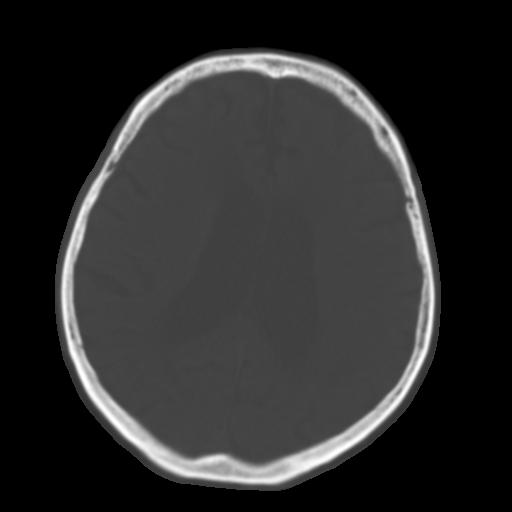
[im 21/36  brain]
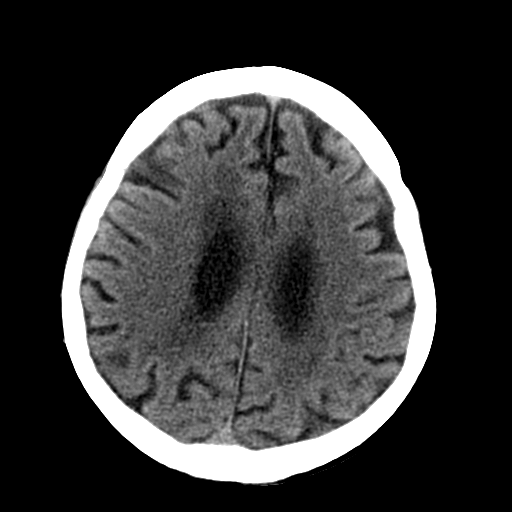
[im 23/36  brain]
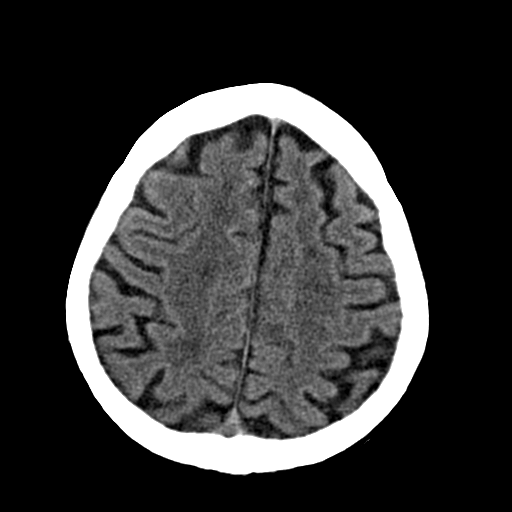
[im 26/36  brain]
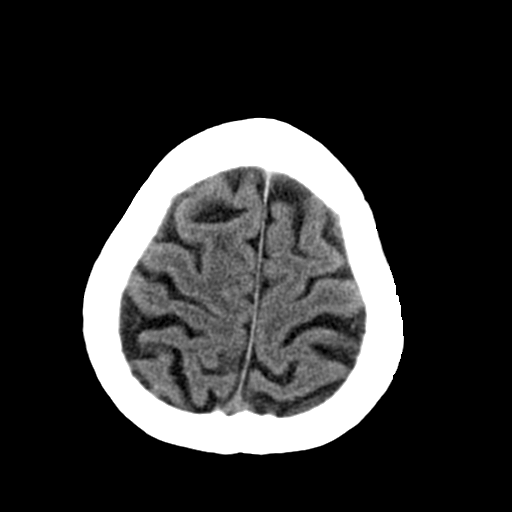
[im 27/36  brain]
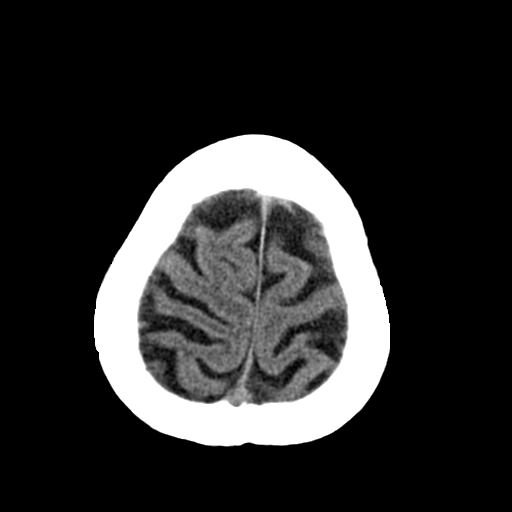
[im 27/36  bone]
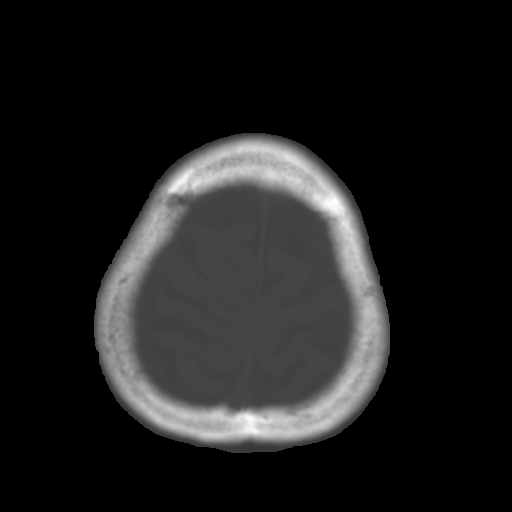
[im 29/36  brain]
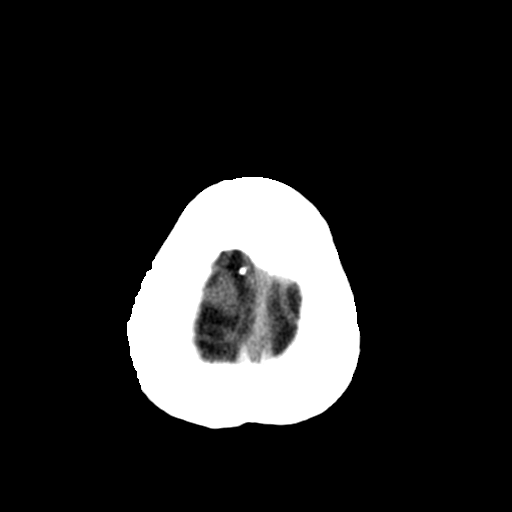
[im 32/36  brain]
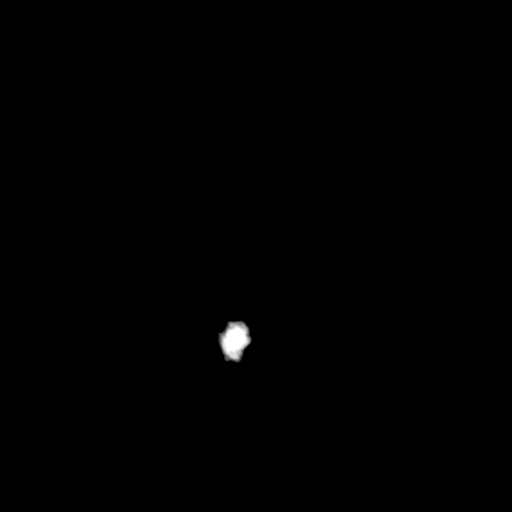
[im 34/36  brain]
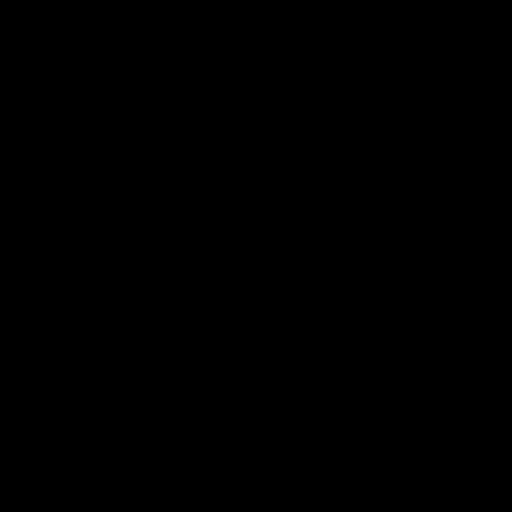

[16 of 30 positions shown; findings below may reference images not displayed]

FINDINGS: The ventricles are normal in configuration. There is ventricular and
sulcal enlargement reflecting moderate atrophy.

There are no parenchymal masses or mass effect. There is no evidence
of a recent cortical infarct. Patchy areas of white matter
hypoattenuation noted consistent with mild to moderate chronic
microvascular ischemic change.

There are no extra-axial masses or abnormal fluid collections.

There is no intracranial hemorrhage.

Mild right ethmoid sinus mucosal thickening. Small mucous retention
cyst in the left sphenoid sinus. Remaining sinuses clear as are the
mastoid air cells.
IMPRESSION: 1. No acute intracranial abnormalities.
2. Moderate atrophy and mild to moderate chronic microvascular
ischemic change, stable from the prior CT.
